# Patient Record
Sex: Female | Born: 1945 | Race: White | Hispanic: No | Smoking: Never smoker
Health system: Southern US, Community
[De-identification: ages and names within clinical notes are randomized; demographics above are authoritative.]

## PROBLEM LIST (undated history)

## (undated) DIAGNOSIS — R011 Cardiac murmur, unspecified: Secondary | ICD-10-CM

## (undated) DIAGNOSIS — I10 Essential (primary) hypertension: Secondary | ICD-10-CM

## (undated) DIAGNOSIS — T7840XA Allergy, unspecified, initial encounter: Secondary | ICD-10-CM

## (undated) DIAGNOSIS — R112 Nausea with vomiting, unspecified: Secondary | ICD-10-CM

## (undated) DIAGNOSIS — Z8489 Family history of other specified conditions: Secondary | ICD-10-CM

## (undated) DIAGNOSIS — Z9889 Other specified postprocedural states: Secondary | ICD-10-CM

## (undated) DIAGNOSIS — M1990 Unspecified osteoarthritis, unspecified site: Secondary | ICD-10-CM

## (undated) DIAGNOSIS — H269 Unspecified cataract: Secondary | ICD-10-CM

## (undated) HISTORY — PX: CHOLECYSTECTOMY: SHX55

## (undated) HISTORY — PX: REPLACEMENT TOTAL KNEE BILATERAL: SUR1225

## (undated) HISTORY — PX: BREAST EXCISIONAL BIOPSY: SUR124

## (undated) HISTORY — PX: ABDOMINAL HYSTERECTOMY: SHX81

## (undated) HISTORY — DX: Allergy, unspecified, initial encounter: T78.40XA

## (undated) HISTORY — DX: Unspecified cataract: H26.9

## (undated) HISTORY — PX: EYE SURGERY: SHX253

## (undated) HISTORY — PX: TOTAL HIP ARTHROPLASTY: SHX124

## (undated) HISTORY — PX: JOINT REPLACEMENT: SHX530

---

## 2001-10-10 ENCOUNTER — Encounter: Payer: Self-pay | Admitting: Orthopedic Surgery

## 2001-10-10 ENCOUNTER — Ambulatory Visit (HOSPITAL_COMMUNITY): Admission: RE | Admit: 2001-10-10 | Discharge: 2001-10-10 | Payer: Self-pay | Admitting: Orthopedic Surgery

## 2002-12-04 ENCOUNTER — Encounter: Payer: Self-pay | Admitting: Internal Medicine

## 2002-12-04 ENCOUNTER — Ambulatory Visit (HOSPITAL_COMMUNITY): Admission: RE | Admit: 2002-12-04 | Discharge: 2002-12-04 | Payer: Self-pay | Admitting: Internal Medicine

## 2002-12-15 ENCOUNTER — Encounter: Payer: Self-pay | Admitting: Internal Medicine

## 2002-12-15 ENCOUNTER — Ambulatory Visit (HOSPITAL_COMMUNITY): Admission: RE | Admit: 2002-12-15 | Discharge: 2002-12-15 | Payer: Self-pay | Admitting: Internal Medicine

## 2003-06-25 ENCOUNTER — Ambulatory Visit (HOSPITAL_COMMUNITY): Admission: RE | Admit: 2003-06-25 | Discharge: 2003-06-25 | Payer: Self-pay | Admitting: Internal Medicine

## 2003-06-25 ENCOUNTER — Encounter: Payer: Self-pay | Admitting: Internal Medicine

## 2003-11-16 ENCOUNTER — Encounter: Payer: Self-pay | Admitting: Orthopedic Surgery

## 2005-03-20 ENCOUNTER — Ambulatory Visit: Payer: Self-pay | Admitting: Orthopedic Surgery

## 2006-03-19 ENCOUNTER — Ambulatory Visit: Payer: Self-pay | Admitting: Orthopedic Surgery

## 2006-05-29 ENCOUNTER — Ambulatory Visit (HOSPITAL_COMMUNITY): Admission: RE | Admit: 2006-05-29 | Discharge: 2006-05-29 | Payer: Self-pay | Admitting: Obstetrics & Gynecology

## 2006-06-14 ENCOUNTER — Ambulatory Visit (HOSPITAL_COMMUNITY): Admission: RE | Admit: 2006-06-14 | Discharge: 2006-06-14 | Payer: Self-pay | Admitting: Obstetrics & Gynecology

## 2007-03-29 ENCOUNTER — Encounter (INDEPENDENT_AMBULATORY_CARE_PROVIDER_SITE_OTHER): Payer: Self-pay | Admitting: Family Medicine

## 2007-06-03 ENCOUNTER — Ambulatory Visit (HOSPITAL_COMMUNITY): Admission: RE | Admit: 2007-06-03 | Discharge: 2007-06-03 | Payer: Self-pay | Admitting: Internal Medicine

## 2007-06-10 ENCOUNTER — Ambulatory Visit: Payer: Self-pay | Admitting: Gastroenterology

## 2007-06-10 ENCOUNTER — Ambulatory Visit (HOSPITAL_COMMUNITY): Admission: RE | Admit: 2007-06-10 | Discharge: 2007-06-10 | Payer: Self-pay | Admitting: Gastroenterology

## 2007-07-01 ENCOUNTER — Ambulatory Visit: Payer: Self-pay | Admitting: Orthopedic Surgery

## 2007-07-15 ENCOUNTER — Ambulatory Visit: Payer: Self-pay | Admitting: Orthopedic Surgery

## 2007-07-18 ENCOUNTER — Ambulatory Visit: Payer: Self-pay | Admitting: Orthopedic Surgery

## 2007-08-20 ENCOUNTER — Inpatient Hospital Stay (HOSPITAL_COMMUNITY): Admission: RE | Admit: 2007-08-20 | Discharge: 2007-08-23 | Payer: Self-pay | Admitting: Orthopedic Surgery

## 2007-08-20 ENCOUNTER — Ambulatory Visit: Payer: Self-pay | Admitting: Orthopedic Surgery

## 2007-08-20 ENCOUNTER — Encounter: Payer: Self-pay | Admitting: Orthopedic Surgery

## 2007-09-03 ENCOUNTER — Ambulatory Visit: Payer: Self-pay | Admitting: Orthopedic Surgery

## 2007-09-10 ENCOUNTER — Encounter (HOSPITAL_COMMUNITY): Admission: RE | Admit: 2007-09-10 | Discharge: 2007-09-24 | Payer: Self-pay | Admitting: Orthopedic Surgery

## 2007-09-17 ENCOUNTER — Ambulatory Visit: Payer: Self-pay | Admitting: Orthopedic Surgery

## 2007-09-25 ENCOUNTER — Encounter (HOSPITAL_COMMUNITY): Admission: RE | Admit: 2007-09-25 | Discharge: 2007-10-25 | Payer: Self-pay | Admitting: Orthopedic Surgery

## 2007-10-15 ENCOUNTER — Ambulatory Visit: Payer: Self-pay | Admitting: Orthopedic Surgery

## 2007-10-15 DIAGNOSIS — M1710 Unilateral primary osteoarthritis, unspecified knee: Secondary | ICD-10-CM | POA: Insufficient documentation

## 2007-11-12 ENCOUNTER — Ambulatory Visit: Payer: Self-pay | Admitting: Orthopedic Surgery

## 2008-02-10 ENCOUNTER — Ambulatory Visit: Payer: Self-pay | Admitting: Orthopedic Surgery

## 2008-02-10 DIAGNOSIS — M6740 Ganglion, unspecified site: Secondary | ICD-10-CM | POA: Insufficient documentation

## 2008-06-03 ENCOUNTER — Ambulatory Visit (HOSPITAL_COMMUNITY): Admission: RE | Admit: 2008-06-03 | Discharge: 2008-06-03 | Payer: Self-pay | Admitting: Family Medicine

## 2008-08-05 ENCOUNTER — Ambulatory Visit: Payer: Self-pay | Admitting: Orthopedic Surgery

## 2008-08-05 ENCOUNTER — Ambulatory Visit (HOSPITAL_COMMUNITY): Admission: RE | Admit: 2008-08-05 | Discharge: 2008-08-05 | Payer: Self-pay | Admitting: Orthopedic Surgery

## 2008-08-20 ENCOUNTER — Telehealth: Payer: Self-pay | Admitting: Orthopedic Surgery

## 2010-12-20 ENCOUNTER — Emergency Department (HOSPITAL_COMMUNITY)
Admission: EM | Admit: 2010-12-20 | Discharge: 2010-12-20 | Payer: Self-pay | Source: Home / Self Care | Admitting: Emergency Medicine

## 2011-05-09 NOTE — Op Note (Signed)
[name], [name]                ACCOUNT NO.:  [id number]   MEDICAL RECORD NO.:  [id number]          PATIENT TYPE:  AMB   LOCATION:  DAY                           FACILITY:  [facility]   PHYSICIAN:  Kassie Mends, M.D.      DATE OF BIRTH:  June 24, 1946   DATE OF PROCEDURE:  06/10/2007  DATE OF DISCHARGE:                               OPERATIVE REPORT   PROCEDURE:  Colonoscopy.   INDICATIONS FOR EXAM:  [name] is a 65 year old female who presents  for average risk colon cancer screening.   FINDINGS:  1. Normal colon without evidence of polyps, masses, inflammatory      changes, diverticula, or AVMs.  2. Normal retroflexed view of the rectum.   RECOMMENDATIONS:  1. She should follow a high fiber diet.  A high fiber diet is      associated with a decreased risk of colon cancer.  She was given a      handout on high fiber diet.  2. Screening colonoscopy in 10 years.   MEDICATIONS:  1. Demerol 75 mg IV.  2. Versed 6 mg IV.   PROCEDURE TECHNIQUE:  Physical examination was performed and informed  consent was obtained from the patient after explaining the benefits,  risks, and alternatives to the procedure.  The patient was connected to  the monitor and was placed in the left lateral position.  Continuous  oxygen was provided by nasal cannula and IV medicine was administered  through an indwelling cannula.  After administration of sedation and  rectal examination, the patient's rectum was intubated and the scope was  advanced under direct visualization to the cecum.  The scope was  withdrawn, by carefully examining the color, texture, anatomy and  integrity of the mucosa on the way out.  The patient was recovered in  endoscopy and discharged home in satisfactory condition.      Kassie Mends, M.D.  Electronically Signed    SM/MEDQ  D:  06/10/2007  T:  06/10/2007  Job:  [id number]   cc:   Kirk Ruths, M.D.  Fax: [phone]

## 2011-05-09 NOTE — Discharge Summary (Signed)
Brenda [name], Brenda [name]                ACCOUNT NO.:  [id number]   MEDICAL RECORD NO.:  [id number]          PATIENT TYPE:  INP   LOCATION:  A308                          FACILITY:  [facility]   PHYSICIAN:  Vickki Hearing, M.D.DATE OF BIRTH:  1946-05-18   DATE OF ADMISSION:  08/20/2007  DATE OF DISCHARGE:  08/29/2008LH                               DISCHARGE SUMMARY   ADMISSION DIAGNOSIS:  Osteoarthritis of the right knee.   DISCHARGE DIAGNOSIS:  Osteoarthritis of the right knee.   SURGICAL PROCEDURES:  Right total knee arthroplasty, date of surgery  August 20, 2007.  This was a computer-assisted knee replacement with  pinless computer system.  Surgery was done with a spinal anesthetic and  the operative findings of valgus osteoarthritis, bone-to-bone changes  lateral compartment.  Primary wear on the distal femoral condyle.  There  was a slight flexion contracture.  Preoperative range of motion was 125  under anesthesia.   IMPLANTS:  Stryker posterior-stabilized #4 femur, #4 tibia, sized #4 9-  mm polyethylene insert, size 29-mm patella.  We did a lateral release  and the patient had a valgus osteoarthritic right knee.  Our  intraoperative range of motion was a 125 degrees on passive flexion,  test full extension.   HISTORY:  This is a 65 year old female who has had four knee  arthroscopies on the left knee, two on the right status, post right  total hip 1999, left total knee 2001, bilateral carpal tunnel releases  and a cholecystectomy, who presented for right total knee replacement  for osteoarthritis of the right knee which failed treatment with anti-  inflammatories, analgesics and injections.   The patient was admitted on August 26 for a right total knee  replacement.  She had the procedure as stated, tolerated it well, and  was started on a post total knee protocol which, included PCA pump, CPM  and Coumadin.   She did very well.  She was able to get 80 degrees of passive  flexion in  her last physical therapy visit with the 3 degrees of extension.  She  had 200 feet ambulation with standby assist.   She had some slight swelling in the knee.  She was afebrile.  The  incision looked great.   We did note a hemoglobin of 7.6, started at 11.1, but she was  asymptomatic.  She was put on iron.  Her PT/INR on the 29th showed an  INR of 4.4, so the patient was not sent home on Coumadin.  We will let  this taper office.  It should be fine for the 10-day period of  anticoagulation.   She is sent home with CPM, a walker, a Cryo/Cuff, a knee immobilizer for  night time use, TED stocking on the right leg, an exercise program,  physical therapy and CPM machine.  She has a follow-up visit for the 9th  to have the staples taken out.   She is discharged on the following medications:  1. Feosol one every 12 hours.  2. Tylox one q.4h. p.r.n. for pain, #60.  3. Colace 100 mg b.i.d.,  also #60.  4. She is to resume her preadmission medications, which include      multivitamin, Caltrate B6.   She is discharged home in stable condition.      Vickki Hearing, M.D.  Electronically Signed     SEH/MEDQ  D:  08/23/2007  T:  08/24/2007  Job:  [id number]

## 2011-05-09 NOTE — H&P (Signed)
Brenda [name], HOUGLAND                ACCOUNT NO.:  [id number]   MEDICAL RECORD NO.:  [id number]          PATIENT TYPE:  AMB   LOCATION:  DAY                           FACILITY:  [facility]   PHYSICIAN:  Vickki Hearing, M.D.DATE OF BIRTH:  [birth year]   DATE OF ADMISSION:  DATE OF DISCHARGE:  LH                              HISTORY & PHYSICAL   HISTORY:  The patient is a 65 year old female status post four  arthroscopies on the left knee and two on the right, status post right  total hip in 1999, left total knee in 2001, status post bilateral carpal  tunnel releases in 1975 and a cholecystectomy.   FAMILY HISTORY:  She has a family history of heart disease and  arthritis.   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   PAST MEDICAL HISTORY:  She has a previous history of carpal tunnel  syndrome.   MEDICATIONS:  She takes aspirin, Osteo-BiFlex, TheraTears, Echinacea,  calcium, Centrum, B12, B6, iron supplement and hydrochlorothiazide 50 mg  daily.   The patient presents for right knee pain which have progressed and  become severe inhibiting her activities of daily living and she is  presenting for right total knee replacement.   She has been treated with anti-inflammatories, analgesics and  injections.  She did well for several months to years but recently has  gotten worse and wishes to have the surgery done on the right knee.   REVIEW OF SYSTEMS:  Negative x10.   PHYSICAL EXAMINATION:  GENERAL APPEARANCE:  Her exam reveals a healthy,  well-developed, well-nourished female.  Grooming and hygiene are intact.  MUSCULOSKELETAL:  There are no deformities other than some mild valgus.  CARDIOVASCULAR:  There is no cardiovascular function.  VITAL SIGNS:  Normal pulses, temperature.  EXTREMITIES:  No edema, tenderness or swelling.  She has range of motion  125 degrees of  knee flexion.  There is no  fusion.  There is some  lateral joint tenderness.  Strength and stability of the knee are  normal.   The previously-noted surgical scars are well-healed without any  erythema.  LYMPH NODES:  Normal.  NEUROLOGICAL:  Gait and station are characteristic of implants as stated  above.  Neurological tests are normal.  She is awake, alert and oriented  x3 with normal mood and affect.   CLINICAL DATA:  X-rays show end-stage arthritis of the right knee.   PLAN:  Right total knee replacement.  The plan is to do a computer-  assisted pinless technique with Stryker knee system.      Vickki Hearing, M.D.  Electronically Signed     SEH/MEDQ  D:  08/19/2007  T:  08/19/2007  Job:  [id number]

## 2011-05-09 NOTE — Op Note (Signed)
NAMEDONJA, [name]                ACCOUNT NO.:  [id number]   MEDICAL RECORD NO.:  [id number]          PATIENT TYPE:  INP   LOCATION:  A308                          FACILITY:  [facility]   PHYSICIAN:  Vickki Hearing, M.D.DATE OF BIRTH:  03-01-1946   DATE OF PROCEDURE:  08/20/2007  DATE OF DISCHARGE:                               OPERATIVE REPORT   HISTORY:  A 65 year old female status post right total hip left total  knee presents with osteoarthritis right knee which failed nonoperative  treatment.   PREOP DIAGNOSIS:  Valgus osteoarthritis right knee.   POSTOP DIAGNOSIS:  Valgus osteoarthritis right knee.   PROCEDURE:  Right total knee.   ANESTHETIC: SPINAL   IMPLANTS:  Striker posterior stabilized 4 femur, 4 tibia, size four 9-mm  polyethylene insert, and a size 29, 9-mm patella.  We also inserted a  pain pump catheter with 1/4% Sensorcaine.   OPERATIVE FINDINGS:  Valgus osteoarthritis right knee with bone-to-bone  changes in the lateral compartment, wear of the distal femoral condyle,  and a slight flexion contracture.  Preop motion was approximately 125  degrees under anesthesia.   DESCRIPTION OF PROCEDURE:  This patient was identified in the preop  holding area, as Brenda [name], her right knee was marked for surgery.  I  countersigned that.  She was given her preoperative antibiotic in the  OR.  She was given a spinal anesthetic in the OR.  She was placed on the  OR table supine.  Foley catheter was inserted; tourniquet was applied to  the right thigh; the leg was prepped and draped with DuraPrep, and  sterile draping technique.  The limb was exsanguinated with a 6-inch  Esmarch, and the tourniquet was inflated to 300 mmHg where it remained  for 2 hours.   The incision was made with the knee in flexion.  The subcutaneous tissue  was divided.  The extensor mechanism was incised with a standard median  parapatellar approach.  Patella was everted.  The medial soft tissue  sleeve was elevated to the mid coronal plane of the tibia.  The lateral  meniscus, medial meniscus, ACL, and PCL were resected.   We used a pinless computer-assisted surgery technique.  We pinned the  tibial marking device, applied the sensors, and applied the cutting  block; adjusted it to take 4-mm resection from the depth of the lateral  compartment using neutral coronal and sagittal alignment.   After this block was pinned, the cut was made with an oscillating saw.  The tibia was sized to a 4.   We applied the alignment device to the distal femur and set that for a  10-mm resection from the medial prominent condyle, made the distal cut,  dropped it back 2 mm as the lateral side would not have resected any  bone.  The femur was then sized to a size 4-1/2, we downsized it to a 4;  and made those four cuts; then checked extension-flexion gap, extension  gap was tight.  We recut the femur an additional 2 mm and this allowed a  spacer block to be  placed for a 9-mm insert.  Flexion gap was equal to  extension gap with lateral compartmnet tightness.  this was released by  releasing the iliotibial band.   The box cut was then made, patella was cut from a 23 down to a 13.  Three peg holes were made.   The wound was irrigated, and the trial implants were placed. The knee  was taken through a range of motion.  Tibial rotation was marked; the  patella had subluxation; it was released until tracking was normal with  a no-touch technique. Bone was irrigated.  Bone was dried.   We then cemented the implants in place, removed excess cement and after  curing trialed with a 9-mm insert, we found this to allow full extension  and flexion with good stability.   We put in a 9-mm polyethylene tray.  All excess cement was removed.  The  knee was irrigated; injected with 60 mL of Sensorcaine with epinephrine.  Capsule was closed with a #1 Bralon suture in an interrupted-and-running  fashion.  The  rent in the lateral capsule was closed with one Bralon  with the knee in flexion; and the patella reduced.   Subcu tissue was closed with #0 and 2-0 Monocryl.  Pain pump catheter  was in the subcu tissue.   Knee was dressed sterilely.  A cryo cuff was applied.   Postop protocol will be the usual routine      Vickki Hearing, M.D.  Electronically Signed     SEH/MEDQ  D:  08/20/2007  T:  08/20/2007  Job:  [id number]

## 2011-10-03 ENCOUNTER — Other Ambulatory Visit (HOSPITAL_COMMUNITY): Payer: Self-pay | Admitting: Family Medicine

## 2011-10-03 DIAGNOSIS — Z139 Encounter for screening, unspecified: Secondary | ICD-10-CM

## 2011-10-06 LAB — CBC
HCT: 22 — ABNORMAL LOW
HCT: 24.6 — ABNORMAL LOW
HCT: 25.2 — ABNORMAL LOW
HCT: 33.4 — ABNORMAL LOW
Hemoglobin: 11.1 — ABNORMAL LOW
Hemoglobin: 8.3 — ABNORMAL LOW
Hemoglobin: 8.4 — ABNORMAL LOW
MCHC: 33.1
MCHC: 33.5
MCHC: 33.6
MCHC: 34.4
MCV: 78.4
MCV: 78.9
MCV: 79.8
Platelets: 201
Platelets: 202
Platelets: 287
RBC: 2.83 — ABNORMAL LOW
RBC: 3.12 — ABNORMAL LOW
RBC: 3.22 — ABNORMAL LOW
RBC: 4.18
RDW: 14.5 — ABNORMAL HIGH
RDW: 14.9 — ABNORMAL HIGH
RDW: 15.1 — ABNORMAL HIGH
RDW: 15.5 — ABNORMAL HIGH
WBC: 11.1 — ABNORMAL HIGH
WBC: 5.6
WBC: 9

## 2011-10-06 LAB — BASIC METABOLIC PANEL WITH GFR
BUN: 19
BUN: 7
CO2: 30
CO2: 31
Calcium: 7.8 — ABNORMAL LOW
Calcium: 8.7
Chloride: 101
Chloride: 96
Creatinine, Ser: 0.63
Creatinine, Ser: 0.65
GFR calc non Af Amer: >60
GFR calc non Af Amer: >60
Glucose, Bld: 125 — ABNORMAL HIGH
Glucose, Bld: 96
Potassium: 3.2 — ABNORMAL LOW
Potassium: 3.7
Sodium: 134 — ABNORMAL LOW
Sodium: 138

## 2011-10-06 LAB — DIFFERENTIAL
Basophils Relative: 0
Eosinophils Absolute: 0
Eosinophils Relative: 1
Lymphocytes Relative: 10 — ABNORMAL LOW
Lymphocytes Relative: 9 — ABNORMAL LOW
Lymphs Abs: 0.8
Lymphs Abs: 1.1
Monocytes Absolute: 0.7
Monocytes Absolute: 1.1 — ABNORMAL HIGH
Monocytes Absolute: 1.2 — ABNORMAL HIGH
Monocytes Relative: 11
Monocytes Relative: 12 — ABNORMAL HIGH
Monocytes Relative: 8
Neutro Abs: 6.7
Neutro Abs: 8.7 — ABNORMAL HIGH
Neutrophils Relative %: 82 — ABNORMAL HIGH

## 2011-10-06 LAB — BASIC METABOLIC PANEL
BUN: 6
Calcium: 7.8 — ABNORMAL LOW
Calcium: 8.2 — ABNORMAL LOW
Creatinine, Ser: 0.58
Creatinine, Ser: 0.59
GFR calc Af Amer: >60
GFR calc non Af Amer: >60
Potassium: 3.2 — ABNORMAL LOW
Potassium: 3.8
Sodium: 141

## 2011-10-06 LAB — CROSSMATCH
ABO/RH(D): O NEG
Antibody Screen: NEGATIVE

## 2011-10-06 LAB — PROTIME-INR
INR: 0.9
INR: 1.1
INR: 4.4 — ABNORMAL HIGH
Prothrombin Time: 12.8
Prothrombin Time: 14.7
Prothrombin Time: 44.6 — ABNORMAL HIGH

## 2011-10-06 LAB — ABO/RH: ABO/RH(D): O NEG

## 2011-10-06 LAB — APTT: aPTT: 32

## 2011-10-10 ENCOUNTER — Ambulatory Visit (HOSPITAL_COMMUNITY)
Admission: RE | Admit: 2011-10-10 | Discharge: 2011-10-10 | Disposition: A | Discharge status: Home / Self Care | Payer: Medicare Other | Source: Ambulatory Visit | Attending: Family Medicine | Admitting: Family Medicine

## 2011-10-10 DIAGNOSIS — Z1231 Encounter for screening mammogram for malignant neoplasm of breast: Secondary | ICD-10-CM | POA: Insufficient documentation

## 2011-10-10 DIAGNOSIS — Z139 Encounter for screening, unspecified: Secondary | ICD-10-CM

## 2012-10-14 ENCOUNTER — Other Ambulatory Visit (HOSPITAL_COMMUNITY): Payer: Self-pay | Admitting: Family Medicine

## 2012-10-14 DIAGNOSIS — Z139 Encounter for screening, unspecified: Secondary | ICD-10-CM

## 2012-10-15 ENCOUNTER — Ambulatory Visit (HOSPITAL_COMMUNITY)
Admission: RE | Admit: 2012-10-15 | Discharge: 2012-10-15 | Disposition: A | Discharge status: Home / Self Care | Payer: Medicare Other | Source: Ambulatory Visit | Attending: Family Medicine | Admitting: Family Medicine

## 2012-10-15 DIAGNOSIS — Z1231 Encounter for screening mammogram for malignant neoplasm of breast: Secondary | ICD-10-CM | POA: Insufficient documentation

## 2012-10-15 DIAGNOSIS — Z139 Encounter for screening, unspecified: Secondary | ICD-10-CM

## 2013-10-13 ENCOUNTER — Other Ambulatory Visit (HOSPITAL_COMMUNITY): Payer: Self-pay | Admitting: Family Medicine

## 2013-10-13 DIAGNOSIS — Z139 Encounter for screening, unspecified: Secondary | ICD-10-CM

## 2013-10-20 ENCOUNTER — Ambulatory Visit (HOSPITAL_COMMUNITY)
Admission: RE | Admit: 2013-10-20 | Discharge: 2013-10-20 | Disposition: A | Discharge status: Home / Self Care | Payer: Medicare Other | Source: Ambulatory Visit | Attending: Family Medicine | Admitting: Family Medicine

## 2013-10-20 DIAGNOSIS — Z1231 Encounter for screening mammogram for malignant neoplasm of breast: Secondary | ICD-10-CM | POA: Insufficient documentation

## 2013-10-20 DIAGNOSIS — Z139 Encounter for screening, unspecified: Secondary | ICD-10-CM

## 2014-06-17 ENCOUNTER — Other Ambulatory Visit (HOSPITAL_COMMUNITY): Payer: Self-pay | Admitting: Internal Medicine

## 2014-06-17 DIAGNOSIS — Z01419 Encounter for gynecological examination (general) (routine) without abnormal findings: Secondary | ICD-10-CM

## 2014-06-29 ENCOUNTER — Ambulatory Visit (HOSPITAL_COMMUNITY)
Admission: RE | Admit: 2014-06-29 | Discharge: 2014-06-29 | Disposition: A | Discharge status: Home / Self Care | Payer: Medicare Other | Source: Ambulatory Visit | Attending: Internal Medicine | Admitting: Internal Medicine

## 2014-06-29 DIAGNOSIS — Z01419 Encounter for gynecological examination (general) (routine) without abnormal findings: Secondary | ICD-10-CM | POA: Insufficient documentation

## 2014-06-29 DIAGNOSIS — Z13820 Encounter for screening for osteoporosis: Secondary | ICD-10-CM | POA: Insufficient documentation

## 2014-12-03 ENCOUNTER — Other Ambulatory Visit (HOSPITAL_COMMUNITY): Payer: Self-pay | Admitting: Physician Assistant

## 2014-12-03 DIAGNOSIS — Z1231 Encounter for screening mammogram for malignant neoplasm of breast: Secondary | ICD-10-CM

## 2014-12-21 ENCOUNTER — Ambulatory Visit (HOSPITAL_COMMUNITY)
Admission: RE | Admit: 2014-12-21 | Discharge: 2014-12-21 | Disposition: A | Discharge status: Home / Self Care | Payer: Medicare Other | Source: Ambulatory Visit | Attending: Physician Assistant | Admitting: Physician Assistant

## 2014-12-21 DIAGNOSIS — R921 Mammographic calcification found on diagnostic imaging of breast: Secondary | ICD-10-CM | POA: Diagnosis not present

## 2014-12-21 DIAGNOSIS — Z1231 Encounter for screening mammogram for malignant neoplasm of breast: Secondary | ICD-10-CM | POA: Diagnosis present

## 2014-12-23 ENCOUNTER — Other Ambulatory Visit: Payer: Self-pay | Admitting: Physician Assistant

## 2014-12-23 DIAGNOSIS — R928 Other abnormal and inconclusive findings on diagnostic imaging of breast: Secondary | ICD-10-CM

## 2015-01-04 DIAGNOSIS — Z6829 Body mass index (BMI) 29.0-29.9, adult: Secondary | ICD-10-CM | POA: Diagnosis not present

## 2015-01-04 DIAGNOSIS — Z7141 Alcohol abuse counseling and surveillance of alcoholic: Secondary | ICD-10-CM | POA: Diagnosis not present

## 2015-01-04 DIAGNOSIS — E663 Overweight: Secondary | ICD-10-CM | POA: Diagnosis not present

## 2015-01-04 DIAGNOSIS — I10 Essential (primary) hypertension: Secondary | ICD-10-CM | POA: Diagnosis not present

## 2015-01-26 ENCOUNTER — Other Ambulatory Visit: Payer: Self-pay | Admitting: Physician Assistant

## 2015-01-26 ENCOUNTER — Ambulatory Visit (HOSPITAL_COMMUNITY)
Admission: RE | Admit: 2015-01-26 | Discharge: 2015-01-26 | Disposition: A | Discharge status: Home / Self Care | Payer: Medicare Other | Source: Ambulatory Visit | Attending: Physician Assistant | Admitting: Physician Assistant

## 2015-01-26 DIAGNOSIS — R921 Mammographic calcification found on diagnostic imaging of breast: Secondary | ICD-10-CM

## 2015-01-26 DIAGNOSIS — R928 Other abnormal and inconclusive findings on diagnostic imaging of breast: Secondary | ICD-10-CM | POA: Diagnosis not present

## 2015-02-01 DIAGNOSIS — Z23 Encounter for immunization: Secondary | ICD-10-CM | POA: Diagnosis not present

## 2015-02-01 DIAGNOSIS — E663 Overweight: Secondary | ICD-10-CM | POA: Diagnosis not present

## 2015-02-01 DIAGNOSIS — Z6828 Body mass index (BMI) 28.0-28.9, adult: Secondary | ICD-10-CM | POA: Diagnosis not present

## 2015-02-01 DIAGNOSIS — Z01419 Encounter for gynecological examination (general) (routine) without abnormal findings: Secondary | ICD-10-CM | POA: Diagnosis not present

## 2015-02-15 ENCOUNTER — Ambulatory Visit
Admission: RE | Admit: 2015-02-15 | Discharge: 2015-02-15 | Disposition: A | Discharge status: Home / Self Care | Payer: Medicare Other | Source: Ambulatory Visit | Attending: Physician Assistant | Admitting: Physician Assistant

## 2015-02-15 ENCOUNTER — Other Ambulatory Visit: Payer: Self-pay | Admitting: Physician Assistant

## 2015-02-15 DIAGNOSIS — R921 Mammographic calcification found on diagnostic imaging of breast: Secondary | ICD-10-CM | POA: Diagnosis not present

## 2015-02-15 DIAGNOSIS — N6489 Other specified disorders of breast: Secondary | ICD-10-CM | POA: Diagnosis not present

## 2015-06-07 DIAGNOSIS — Z6825 Body mass index (BMI) 25.0-25.9, adult: Secondary | ICD-10-CM | POA: Diagnosis not present

## 2015-06-07 DIAGNOSIS — E782 Mixed hyperlipidemia: Secondary | ICD-10-CM | POA: Diagnosis not present

## 2015-06-07 DIAGNOSIS — I10 Essential (primary) hypertension: Secondary | ICD-10-CM | POA: Diagnosis not present

## 2015-06-07 DIAGNOSIS — E663 Overweight: Secondary | ICD-10-CM | POA: Diagnosis not present

## 2015-06-15 DIAGNOSIS — E782 Mixed hyperlipidemia: Secondary | ICD-10-CM | POA: Diagnosis not present

## 2015-06-15 DIAGNOSIS — Z6825 Body mass index (BMI) 25.0-25.9, adult: Secondary | ICD-10-CM | POA: Diagnosis not present

## 2015-06-15 DIAGNOSIS — Z0000 Encounter for general adult medical examination without abnormal findings: Secondary | ICD-10-CM | POA: Diagnosis not present

## 2015-06-15 DIAGNOSIS — E663 Overweight: Secondary | ICD-10-CM | POA: Diagnosis not present

## 2015-06-15 DIAGNOSIS — I10 Essential (primary) hypertension: Secondary | ICD-10-CM | POA: Diagnosis not present

## 2015-10-06 DIAGNOSIS — Z23 Encounter for immunization: Secondary | ICD-10-CM | POA: Diagnosis not present

## 2015-11-09 ENCOUNTER — Other Ambulatory Visit (HOSPITAL_COMMUNITY): Payer: Self-pay | Admitting: Physician Assistant

## 2015-11-09 DIAGNOSIS — Z1231 Encounter for screening mammogram for malignant neoplasm of breast: Secondary | ICD-10-CM

## 2015-11-29 DIAGNOSIS — Z6823 Body mass index (BMI) 23.0-23.9, adult: Secondary | ICD-10-CM | POA: Diagnosis not present

## 2015-11-29 DIAGNOSIS — Z1389 Encounter for screening for other disorder: Secondary | ICD-10-CM | POA: Diagnosis not present

## 2015-11-29 DIAGNOSIS — J069 Acute upper respiratory infection, unspecified: Secondary | ICD-10-CM | POA: Diagnosis not present

## 2015-12-03 DIAGNOSIS — Z6822 Body mass index (BMI) 22.0-22.9, adult: Secondary | ICD-10-CM | POA: Diagnosis not present

## 2015-12-03 DIAGNOSIS — R05 Cough: Secondary | ICD-10-CM | POA: Diagnosis not present

## 2015-12-03 DIAGNOSIS — Z1389 Encounter for screening for other disorder: Secondary | ICD-10-CM | POA: Diagnosis not present

## 2015-12-03 DIAGNOSIS — J329 Chronic sinusitis, unspecified: Secondary | ICD-10-CM | POA: Diagnosis not present

## 2015-12-24 ENCOUNTER — Ambulatory Visit (HOSPITAL_COMMUNITY)
Admission: RE | Admit: 2015-12-24 | Discharge: 2015-12-24 | Disposition: A | Discharge status: Home / Self Care | Payer: Medicare Other | Source: Ambulatory Visit | Attending: Physician Assistant | Admitting: Physician Assistant

## 2015-12-24 DIAGNOSIS — Z1231 Encounter for screening mammogram for malignant neoplasm of breast: Secondary | ICD-10-CM | POA: Insufficient documentation

## 2016-05-26 DIAGNOSIS — Z1389 Encounter for screening for other disorder: Secondary | ICD-10-CM | POA: Diagnosis not present

## 2016-05-26 DIAGNOSIS — I10 Essential (primary) hypertension: Secondary | ICD-10-CM | POA: Diagnosis not present

## 2016-05-26 DIAGNOSIS — E782 Mixed hyperlipidemia: Secondary | ICD-10-CM | POA: Diagnosis not present

## 2016-05-26 DIAGNOSIS — Z6821 Body mass index (BMI) 21.0-21.9, adult: Secondary | ICD-10-CM | POA: Diagnosis not present

## 2016-06-01 ENCOUNTER — Ambulatory Visit (INDEPENDENT_AMBULATORY_CARE_PROVIDER_SITE_OTHER): Payer: Medicare Other

## 2016-06-01 ENCOUNTER — Ambulatory Visit (INDEPENDENT_AMBULATORY_CARE_PROVIDER_SITE_OTHER): Payer: Medicare Other | Admitting: Orthopedic Surgery

## 2016-06-01 VITALS — BP 114/75 | HR 85 | Ht 64.0 in | Wt 131.6 lb

## 2016-06-01 DIAGNOSIS — Z96652 Presence of left artificial knee joint: Secondary | ICD-10-CM | POA: Diagnosis not present

## 2016-06-01 DIAGNOSIS — Z96649 Presence of unspecified artificial hip joint: Secondary | ICD-10-CM

## 2016-06-01 DIAGNOSIS — Z9660 Presence of unspecified orthopedic joint implant: Secondary | ICD-10-CM | POA: Diagnosis not present

## 2016-06-01 DIAGNOSIS — Z96651 Presence of right artificial knee joint: Secondary | ICD-10-CM | POA: Diagnosis not present

## 2016-06-01 MED ORDER — CLINDAMYCIN HCL 300 MG PO CAPS: ORAL_CAPSULE | ORAL | Status: DC

## 2016-06-01 NOTE — Progress Notes (Signed)
Patient ID: Brenda [name], female   DOB: [birth year], 70 y.o.   MRN: [id number]  Chief Complaint  Patient presents with  . New Patient (Initial Visit)    Right hip DOS 03/23/1998, Right knee DOS 08/20/2007 Left knee DOS3/27/2001    HPI the patient is 18 years status post right total hip and 9 year status post right total knee and 16 years status post left total knee doing well. However, she is concerned that her dentist will no longer prescribed prophylactic antibiotics for her.  She would like her hips and knees evaluated and then discuss her situation regarding antibiotics  ROS  She reports no abnormalities on review of systems specifically no chest pain shortness of breath constipation diarrhea photophobia night sweats fever.  She had a tonsillectomy she had bilateral carpal tunnel releases partial hysterectomy prior knee surgery had a left knee arthroscopy and arthrotomy 3 she had right knee arthroscopy cholecystectomy total left knee total right knee and total right hip as stated   BP 114/75 mmHg  Pulse 85  Ht 5\' 4"  (1.626 m)  Wt 131 lb 9.6 oz (59.693 kg)  BMI 22.58 kg/m2  Physical Exam Physical Exam  Constitutional: The patient appears well-developed and well-nourished. No distress.  The patient is oriented to person, place, and time.  Psychiatric: The patient has a normal mood and affect.  Cardiovascular: Intact distal pulses.   Neurological: sensation is normal  Skin: Skin is warm and dry. No rash noted. The patient is not diaphoretic. No erythema. No pallor.    Ortho Exam Normal range of motion in the right hip with no instability muscle strength and tone are normal leg lengths are equal no tenderness  Both knees have well-healed incisions in the midline knee flexion is 120 on the right 125 on the left both knees are stable right and left knee which exhibit normal strength and muscle tone skin incisions clean dry and intact normal distal pulses without edema in each leg  and normal sensation in the right and left leg as well  X-rays of the hip show no abnormalities in the stem or cup  Both knee film shows stable total knees with no loosening please see report for details   ASSESSMENT AND PLAN   I reviewed the current recommendations regarding dental prophylaxis and the patient decided to continue with prophylaxis she is allergic to penicillin so we put her on clindamycin with standing order for refills as needed for dental procedure Fuller CanadaStanley Harrison, MD 06/01/2016 5:38 PM

## 2016-07-01 IMAGING — MG MM DIGITAL DIAGNOSTIC UNILAT*R*
2 series · 2 of 2 positions shown · non-contrast
Comparison: Prior mammograms.

CLINICAL DATA: Screening recall for calcifications in the central
right breast.

EXAM:
DIGITAL DIAGNOSTIC RIGHT MAMMOGRAM

[R CC]
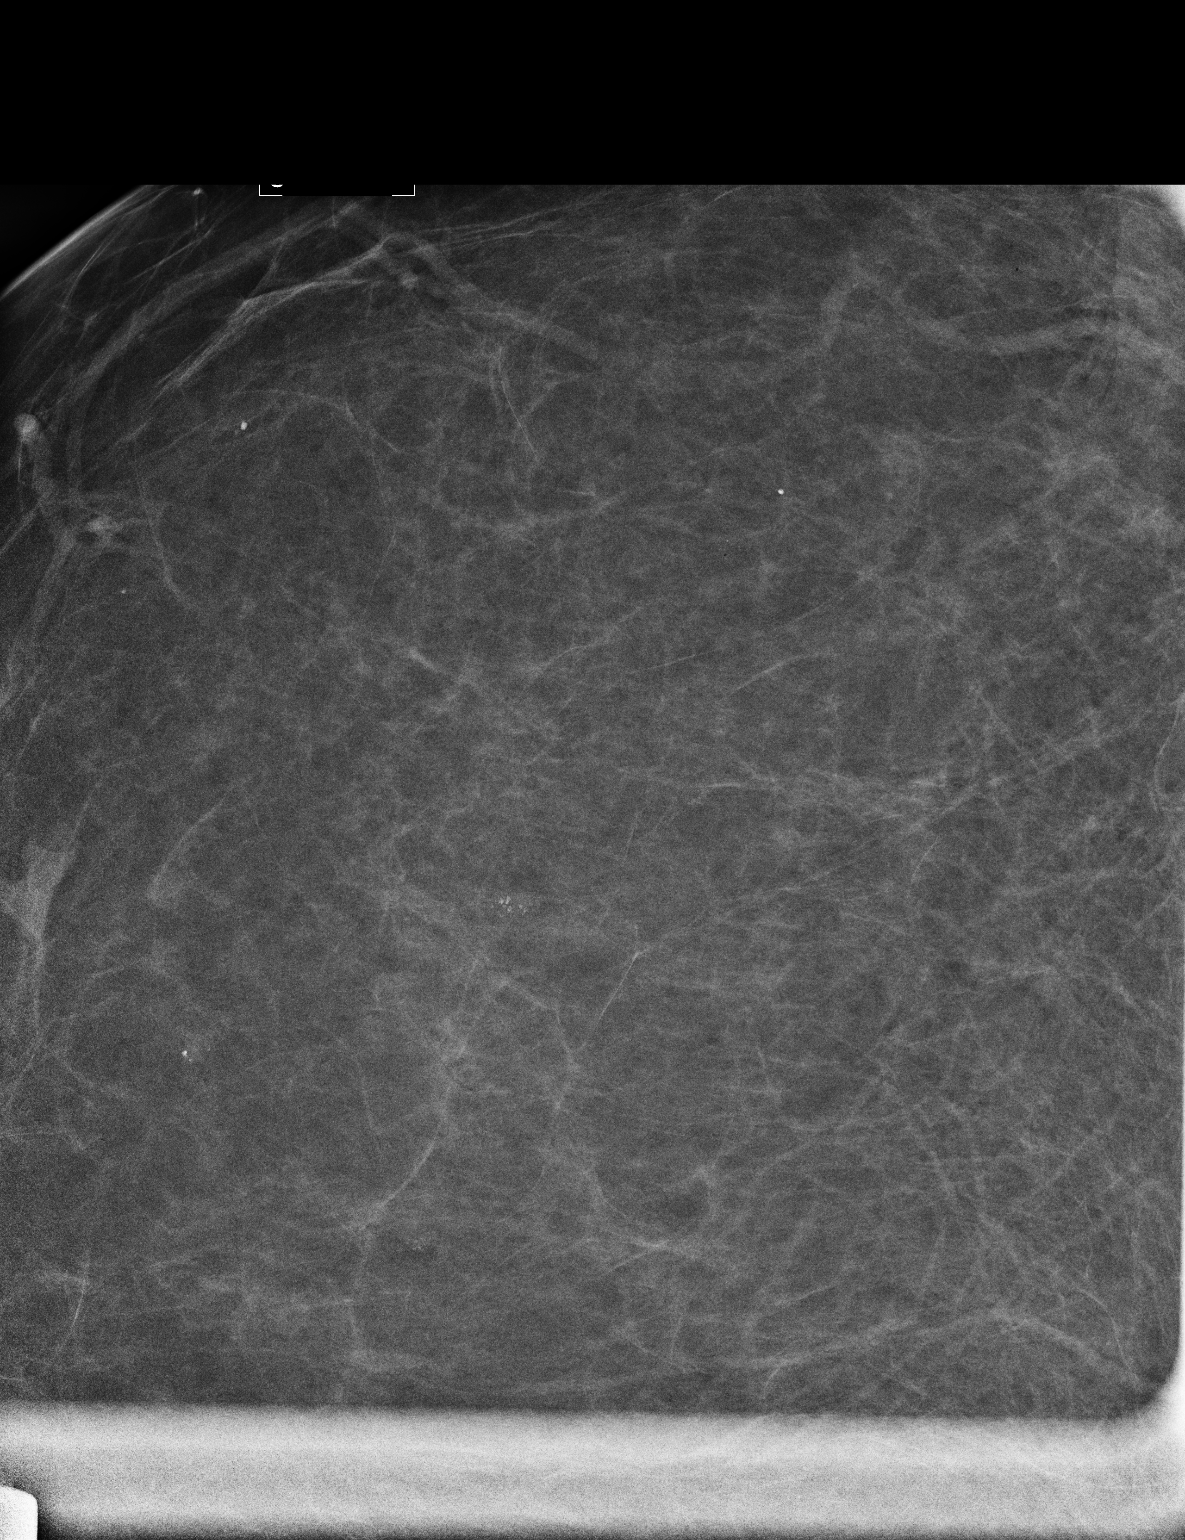

[R ML]
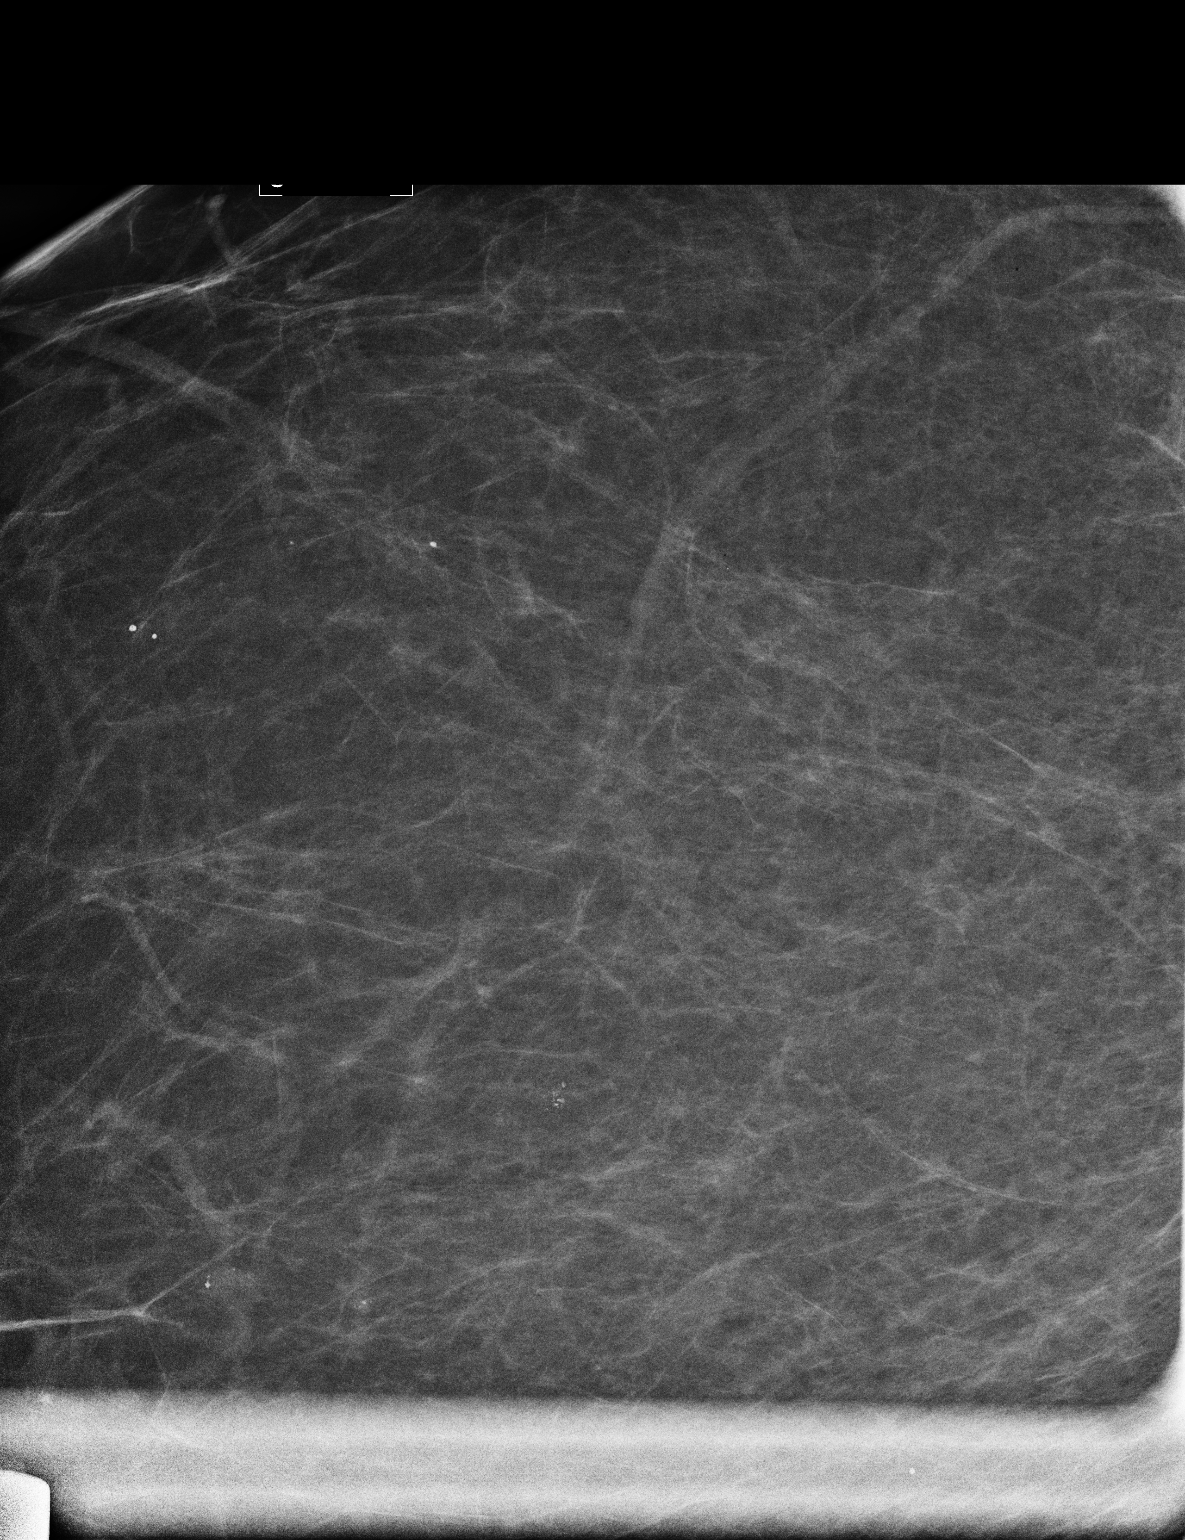

[2 of 2 positions shown; findings below may reference images not displayed]

ACR Breast Density Category b: There are scattered areas of
fibroglandular density.
FINDINGS: Spot compression magnification CC and ML views were performed of the
central right breast demonstrating a group of amorphous
calcifications spanning a distance of approximately 3-4 mm.
IMPRESSION: Suspicious right breast calcifications.

RECOMMENDATION:
Stereotactic guided biopsy of the suspicious calcifications in the
central right breast is recommended.

The patient states she has difficulty making time for appointments
due to the need to care for her [AGE] mother, and the
procedure will be scheduled at the patient's convenience.

I have discussed the findings and recommendations with the patient.
Results were also provided in writing at the conclusion of the
visit. If applicable, a reminder letter will be sent to the patient
regarding the next appointment.

BI-RADS CATEGORY  4: Suspicious.

## 2016-09-08 DIAGNOSIS — Z23 Encounter for immunization: Secondary | ICD-10-CM | POA: Diagnosis not present

## 2016-09-08 DIAGNOSIS — J329 Chronic sinusitis, unspecified: Secondary | ICD-10-CM | POA: Diagnosis not present

## 2016-09-08 DIAGNOSIS — J029 Acute pharyngitis, unspecified: Secondary | ICD-10-CM | POA: Diagnosis not present

## 2016-09-08 DIAGNOSIS — I10 Essential (primary) hypertension: Secondary | ICD-10-CM | POA: Diagnosis not present

## 2016-09-08 DIAGNOSIS — Z6822 Body mass index (BMI) 22.0-22.9, adult: Secondary | ICD-10-CM | POA: Diagnosis not present

## 2016-09-15 DIAGNOSIS — Z6822 Body mass index (BMI) 22.0-22.9, adult: Secondary | ICD-10-CM | POA: Diagnosis not present

## 2016-09-15 DIAGNOSIS — I10 Essential (primary) hypertension: Secondary | ICD-10-CM | POA: Diagnosis not present

## 2016-09-15 DIAGNOSIS — E782 Mixed hyperlipidemia: Secondary | ICD-10-CM | POA: Diagnosis not present

## 2016-09-15 DIAGNOSIS — J329 Chronic sinusitis, unspecified: Secondary | ICD-10-CM | POA: Diagnosis not present

## 2016-11-08 DIAGNOSIS — Z1389 Encounter for screening for other disorder: Secondary | ICD-10-CM | POA: Diagnosis not present

## 2016-11-08 DIAGNOSIS — Z0000 Encounter for general adult medical examination without abnormal findings: Secondary | ICD-10-CM | POA: Diagnosis not present

## 2016-11-08 DIAGNOSIS — E782 Mixed hyperlipidemia: Secondary | ICD-10-CM | POA: Diagnosis not present

## 2016-11-08 DIAGNOSIS — Z6822 Body mass index (BMI) 22.0-22.9, adult: Secondary | ICD-10-CM | POA: Diagnosis not present

## 2016-11-08 DIAGNOSIS — I10 Essential (primary) hypertension: Secondary | ICD-10-CM | POA: Diagnosis not present

## 2016-11-24 DIAGNOSIS — H52223 Regular astigmatism, bilateral: Secondary | ICD-10-CM | POA: Diagnosis not present

## 2016-11-24 DIAGNOSIS — H5203 Hypermetropia, bilateral: Secondary | ICD-10-CM | POA: Diagnosis not present

## 2016-11-24 DIAGNOSIS — H11001 Unspecified pterygium of right eye: Secondary | ICD-10-CM | POA: Diagnosis not present

## 2016-11-24 DIAGNOSIS — H524 Presbyopia: Secondary | ICD-10-CM | POA: Diagnosis not present

## 2016-12-27 DIAGNOSIS — H5203 Hypermetropia, bilateral: Secondary | ICD-10-CM | POA: Diagnosis not present

## 2016-12-27 DIAGNOSIS — H25813 Combined forms of age-related cataract, bilateral: Secondary | ICD-10-CM | POA: Diagnosis not present

## 2016-12-27 DIAGNOSIS — H11001 Unspecified pterygium of right eye: Secondary | ICD-10-CM | POA: Diagnosis not present

## 2016-12-27 DIAGNOSIS — H11002 Unspecified pterygium of left eye: Secondary | ICD-10-CM | POA: Diagnosis not present

## 2016-12-28 DIAGNOSIS — H25812 Combined forms of age-related cataract, left eye: Secondary | ICD-10-CM | POA: Diagnosis not present

## 2017-01-08 NOTE — Patient Instructions (Signed)
Riki AltesJudy S Busic  01/08/2017     @PREFPERIOPPHARMACY @   Your procedure is scheduled on 01/15/2017.  [facility]  Call this number if you have problems the morning of surgery:  [phone]   Remember:  Do not eat food or drink liquids after midnight.  Take these medicines the morning of surgery with A SIP OF WATER None   Do not wear jewelry, make-up or nail polish.  Do not wear lotions, powders, or perfumes, or deoderant.  Do not shave 48 hours prior to surgery.  Men may shave face and neck.  Do not bring valuables to the hospital.  [facility] is not responsible for any belongings or valuables.  Contacts, dentures or bridgework may not be worn into surgery.  Leave your suitcase in the car.  After surgery it may be brought to your room.  For patients admitted to the hospital, discharge time will be determined by your treatment team.  Patients discharged the day of surgery will not be allowed to drive home.    Please read over the following fact sheets that you were given. Anesthesia Post-op Instructions     PATIENT INSTRUCTIONS POST-ANESTHESIA  IMMEDIATELY FOLLOWING SURGERY:  Do not drive or operate machinery for the first twenty four hours after surgery.  Do not make any important decisions for twenty four hours after surgery or while taking narcotic pain medications or sedatives.  If you develop intractable nausea and vomiting or a severe headache please notify your doctor immediately.  FOLLOW-UP:  Please make an appointment with your surgeon as instructed. You do not need to follow up with anesthesia unless specifically instructed to do so.  WOUND CARE INSTRUCTIONS (if applicable):  Keep a dry clean dressing on the anesthesia/puncture wound site if there is drainage.  Once the wound has quit draining you may leave it open to air.  Generally you should leave the bandage intact for twenty four hours unless there is drainage.  If the epidural site drains  for more than 36-48 hours please call the anesthesia department.  QUESTIONS?:  Please feel free to call your physician or the hospital operator if you have any questions, and they will be happy to assist you.      Cataract Surgery Cataract surgery is a procedure to remove a cataract from your eye. A cataract is cloudiness on the lens of your eye. The lens focuses light inside the eye. When a lens becomes cloudy, your vision is affected. Cataract surgery is a procedure to remove the cloudy lens. A substitute lens (intraocular lens or IOL) is usually inserted as a replacement for the cloudy lens. Tell a health care provider about:  Any allergies you have.  All medicines you are taking, including vitamins, herbs, eye drops, creams, and over-the-counter medicines.  Any problems you or family members have had with anesthetic medicines.  Any blood disorders you have.  Any surgeries you have had, especially eye surgeries that include refractive surgery, such as PRK and LASIK.  Any medical conditions you have.  Whether you are pregnant or may be pregnant. What are the risks? Generally, this is a safe procedure. However, problems may occur, including:  Infection.  Bleeding.  Glaucoma.  Retinal detachment.  Allergic reactions to medicines.  Damage to other structures or organs.  Inflammation of the eye.  Clouding of the part of your eye that holds an IOL in place (after-cataract), if an IOL was inserted. This is fairly common.  An IOL moving out of position, if an IOL was inserted. This is very rare.  Loss of vision. This is rare. What happens before the procedure?  Follow instructions from your health care provider about eating or drinking restrictions.  Ask your health care provider about:  Changing or stopping your regular medicines, including any eye drops you have been prescribed. This is especially important if you are taking diabetes medicines or blood  thinners.  Taking medicines such as aspirin and ibuprofen. These medicines can thin your blood. Do not take these medicines before your procedure if your health care provider instructs you not to.  Do not put contact lenses in either eye on the day of your surgery.  Plan for someone to drive you to and from the procedure.  If you will be going home right after the procedure, plan to have someone with you for 24 hours. What happens during the procedure?  An IV tube may be inserted into one of your veins.  You will be given one or more of the following:  A medicine to help you relax (sedative).  A medicine to numb the area (local anesthetic). This may be numbing eye drops or an injection that is given behind the eye.  A small cut (incision) will be made to the edge of the clear, dome-shaped surface that covers the front of the eye (cornea).  A small probe will be inserted into the eye. This device gives off ultrasound waves that soften and break up the cloudy center of the lens. This makes it easier for the cloudy lens to be removed by suction.  An IOL may be implanted.  Part of the capsule that surrounds the lens will be left in the eye to support the IOL.  Your surgeon may use stitches (sutures) to close the incision. The procedure may vary among health care providers and hospitals. What happens after the procedure?  Your blood pressure, heart rate, breathing rate, and blood oxygen level will be monitored often until the medicines you were given have worn off.  You may be given a protective shield to wear over your eyes.  Do not drive for 24 hours if you received a sedative. This information is not intended to replace advice given to you by your health care provider. Make sure you discuss any questions you have with your health care provider. Document Released: 11/30/2011 Document Revised: 05/18/2016 Document Reviewed: 10/21/2015 Elsevier Interactive Patient Education  2017  [employer].

## 2017-01-10 ENCOUNTER — Encounter (HOSPITAL_COMMUNITY)
Admission: RE | Admit: 2017-01-10 | Discharge: 2017-01-10 | Disposition: A | Discharge status: Home / Self Care | Payer: Medicare Other | Source: Ambulatory Visit | Attending: Ophthalmology | Admitting: Ophthalmology

## 2017-01-10 ENCOUNTER — Encounter (HOSPITAL_COMMUNITY): Payer: Self-pay

## 2017-01-10 DIAGNOSIS — Z01818 Encounter for other preprocedural examination: Secondary | ICD-10-CM | POA: Diagnosis not present

## 2017-01-10 DIAGNOSIS — H2512 Age-related nuclear cataract, left eye: Secondary | ICD-10-CM | POA: Insufficient documentation

## 2017-01-10 DIAGNOSIS — Z01812 Encounter for preprocedural laboratory examination: Secondary | ICD-10-CM | POA: Diagnosis not present

## 2017-01-10 HISTORY — DX: Essential (primary) hypertension: I10

## 2017-01-10 HISTORY — DX: Unspecified osteoarthritis, unspecified site: M19.90

## 2017-01-10 LAB — CBC
HCT: 38.6 % (ref 36.0–46.0)
Hemoglobin: 13 g/dL (ref 12.0–15.0)
MCH: 28.5 pg (ref 26.0–34.0)
MCHC: 33.7 g/dL (ref 30.0–36.0)
MCV: 84.6 fL (ref 78.0–100.0)
Platelets: 213 10*3/uL (ref 150–400)
RBC: 4.56 MIL/uL (ref 3.87–5.11)
RDW: 15.2 % (ref 11.5–15.5)
WBC: 5.8 10*3/uL (ref 4.0–10.5)

## 2017-01-10 LAB — BASIC METABOLIC PANEL
Anion gap: 9 (ref 5–15)
BUN: 32 mg/dL — AB (ref 6–20)
CO2: 27 mmol/L (ref 22–32)
Calcium: 9.2 mg/dL (ref 8.9–10.3)
Chloride: 102 mmol/L (ref 101–111)
Creatinine, Ser: 0.63 mg/dL (ref 0.44–1.00)
GFR calc Af Amer: >60 mL/min (ref >60)
GFR calc non Af Amer: >60 mL/min (ref >60)
Glucose, Bld: 111 mg/dL — ABNORMAL HIGH (ref 65–99)
POTASSIUM: 3.6 mmol/L (ref 3.5–5.1)
Sodium: 138 mmol/L (ref 135–145)

## 2017-01-15 ENCOUNTER — Encounter (HOSPITAL_COMMUNITY): Payer: Self-pay | Admitting: *Deleted

## 2017-01-15 ENCOUNTER — Ambulatory Visit (HOSPITAL_COMMUNITY): Payer: Medicare Other | Admitting: Anesthesiology

## 2017-01-15 ENCOUNTER — Ambulatory Visit (HOSPITAL_COMMUNITY)
Admission: RE | Admit: 2017-01-15 | Discharge: 2017-01-15 | Disposition: A | Discharge status: Home / Self Care | Payer: Medicare Other | Source: Ambulatory Visit | Attending: Ophthalmology | Admitting: Ophthalmology

## 2017-01-15 ENCOUNTER — Encounter (HOSPITAL_COMMUNITY)
Admission: RE | Disposition: A | Discharge status: Home / Self Care | Payer: Self-pay | Source: Ambulatory Visit | Attending: Ophthalmology

## 2017-01-15 DIAGNOSIS — I10 Essential (primary) hypertension: Secondary | ICD-10-CM | POA: Diagnosis not present

## 2017-01-15 DIAGNOSIS — H25812 Combined forms of age-related cataract, left eye: Secondary | ICD-10-CM | POA: Diagnosis not present

## 2017-01-15 DIAGNOSIS — H2512 Age-related nuclear cataract, left eye: Secondary | ICD-10-CM | POA: Diagnosis not present

## 2017-01-15 HISTORY — PX: CATARACT EXTRACTION W/PHACO: SHX586

## 2017-01-15 SURGERY — PHACOEMULSIFICATION, CATARACT, WITH IOL INSERTION
Anesthesia: Monitor Anesthesia Care | Site: Eye | Laterality: Left

## 2017-01-15 MED ORDER — POVIDONE-IODINE 5 % OP SOLN
OPHTHALMIC | Status: DC | PRN
  Administered 2017-01-15: 1 via OPHTHALMIC

## 2017-01-15 MED ORDER — FENTANYL CITRATE (PF) 100 MCG/2ML IJ SOLN
25.0000 ug | INTRAMUSCULAR | Status: DC | PRN
  Administered 2017-01-15: 25 ug via INTRAVENOUS

## 2017-01-15 MED ORDER — CYCLOPENTOLATE-PHENYLEPHRINE 0.2-1 % OP SOLN
1.0000 [drp] | OPHTHALMIC | Status: AC
  Administered 2017-01-15 (×3): 1 [drp] via OPHTHALMIC

## 2017-01-15 MED ORDER — LIDOCAINE 3.5 % OP GEL OPTIME - NO CHARGE
OPHTHALMIC | Status: DC | PRN
  Administered 2017-01-15: 1 [drp] via OPHTHALMIC

## 2017-01-15 MED ORDER — NEOMYCIN-POLYMYXIN-DEXAMETH 3.5-10000-0.1 OP SUSP
OPHTHALMIC | Status: DC | PRN
  Administered 2017-01-15: 2 [drp]

## 2017-01-15 MED ORDER — PROVISC 10 MG/ML IO SOLN
INTRAOCULAR | Status: DC | PRN
  Administered 2017-01-15: 0.85 mL via INTRAOCULAR

## 2017-01-15 MED ORDER — EPINEPHRINE PF 1 MG/ML IJ SOLN
INTRAMUSCULAR | Status: AC
  Filled 2017-01-15: qty 1

## 2017-01-15 MED ORDER — LACTATED RINGERS IV SOLN
INTRAVENOUS | Status: DC
  Administered 2017-01-15: 1000 mL via INTRAVENOUS

## 2017-01-15 MED ORDER — PHENYLEPHRINE HCL 2.5 % OP SOLN
1.0000 [drp] | OPHTHALMIC | Status: AC
  Administered 2017-01-15 (×3): 1 [drp] via OPHTHALMIC

## 2017-01-15 MED ORDER — FENTANYL CITRATE (PF) 100 MCG/2ML IJ SOLN
INTRAMUSCULAR | Status: AC
  Filled 2017-01-15: qty 2

## 2017-01-15 MED ORDER — MIDAZOLAM HCL 2 MG/2ML IJ SOLN
0.5000 mg | INTRAMUSCULAR | Status: DC | PRN
  Administered 2017-01-15: 2 mg via INTRAVENOUS

## 2017-01-15 MED ORDER — LIDOCAINE HCL (PF) 1 % IJ SOLN
INTRAMUSCULAR | Status: DC | PRN
  Administered 2017-01-15: .6 mL

## 2017-01-15 MED ORDER — BSS IO SOLN
INTRAOCULAR | Status: DC | PRN
  Administered 2017-01-15: 15 mL via INTRAOCULAR

## 2017-01-15 MED ORDER — LIDOCAINE HCL 3.5 % OP GEL
1.0000 "application " | Freq: Once | OPHTHALMIC | Status: AC
  Administered 2017-01-15: 1 via OPHTHALMIC

## 2017-01-15 MED ORDER — TETRACAINE HCL 0.5 % OP SOLN
1.0000 [drp] | OPHTHALMIC | Status: AC
  Administered 2017-01-15 (×3): 1 [drp] via OPHTHALMIC

## 2017-01-15 MED ORDER — EPINEPHRINE PF 1 MG/ML IJ SOLN
INTRAOCULAR | Status: DC | PRN
  Administered 2017-01-15: 500 mL

## 2017-01-15 MED ORDER — MIDAZOLAM HCL 2 MG/2ML IJ SOLN
INTRAMUSCULAR | Status: AC
  Filled 2017-01-15: qty 2

## 2017-01-15 SURGICAL SUPPLY — 24 items
CAPSULAR TENSION RING-AMO (OPHTHALMIC RELATED) IMPLANT
CLOTH BEACON ORANGE TIMEOUT ST (SAFETY) ×3 IMPLANT
EYE SHIELD UNIVERSAL CLEAR (GAUZE/BANDAGES/DRESSINGS) ×3 IMPLANT
GLOVE BIOGEL PI IND STRL 6.5 (GLOVE) IMPLANT
GLOVE BIOGEL PI IND STRL 7.0 (GLOVE) ×1 IMPLANT
GLOVE BIOGEL PI INDICATOR 6.5 (GLOVE)
GLOVE BIOGEL PI INDICATOR 7.0 (GLOVE) ×2
GLOVE EXAM NITRILE LRG STRL (GLOVE) ×3 IMPLANT
GLOVE EXAM NITRILE MD LF STRL (GLOVE) IMPLANT
KIT VITRECTOMY (OPHTHALMIC RELATED) IMPLANT
LENS IOL IQ RESTOR TRC 22.0 ×1 IMPLANT
LENS IOL RESTOR TORIC 3D 22.0 ×3 IMPLANT
PAD ARMBOARD 7.5X6 YLW CONV (MISCELLANEOUS) ×3 IMPLANT
PROC W NO LENS (INTRAOCULAR LENS)
PROC W SPEC LENS (INTRAOCULAR LENS) ×3
PROCESS W NO LENS (INTRAOCULAR LENS) IMPLANT
PROCESS W SPEC LENS (INTRAOCULAR LENS) ×1 IMPLANT
RETRACTOR IRIS SIGHTPATH (OPHTHALMIC RELATED) IMPLANT
RING MALYGIN (MISCELLANEOUS) IMPLANT
SYRINGE LUER LOK 1CC (MISCELLANEOUS) ×3 IMPLANT
TAPE SURG TRANSPORE 1 IN (GAUZE/BANDAGES/DRESSINGS) ×1 IMPLANT
TAPE SURGICAL TRANSPORE 1 IN (GAUZE/BANDAGES/DRESSINGS) ×2
VISCOELASTIC ADDITIONAL (OPHTHALMIC RELATED) IMPLANT
WATER STERILE IRR 250ML POUR (IV SOLUTION) ×3 IMPLANT

## 2017-01-15 NOTE — Transfer of Care (Signed)
Immediate Anesthesia Transfer of Care Note  Patient: Brenda [name]  Procedure(s) Performed: Procedure(s) with comments: CATARACT EXTRACTION PHACO AND INTRAOCULAR LENS PLACEMENT (IOC) (Left) - cde-7.04  Patient Location: PACU  Anesthesia Type:MAC  Level of Consciousness: awake, alert , oriented and patient cooperative  Airway & Oxygen Therapy: Patient Spontanous Breathing  Post-op Assessment: Report given to RN and Post -op Vital signs reviewed and stable  Post vital signs: Reviewed and stable  Last Vitals:  Vitals:   01/15/17 1015 01/15/17 1020  BP: (!) 115/58 113/66  Pulse:    Resp: 16 18  Temp:      Last Pain:  Vitals:   01/15/17 0930  TempSrc: Oral      Patients Stated Pain Goal: 4 (88/50/27 7412)  Complications: No apparent anesthesia complications

## 2017-01-15 NOTE — H&P (Signed)
I have reviewed the H&P, the patient was re-examined, and I have identified no interval changes in medical condition and plan of care since the history and physical of record  

## 2017-01-15 NOTE — Discharge Instructions (Signed)
Moderate Conscious Sedation, Adult, Care After °These instructions provide you with information about caring for yourself after your procedure. Your health care provider may also give you more specific instructions. Your treatment has been planned according to current medical practices, but problems sometimes occur. Call your health care provider if you have any problems or questions after your procedure. °What can I expect after the procedure? °After your procedure, it is common: °· To feel sleepy for several hours. °· To feel clumsy and have poor balance for several hours. °· To have poor judgment for several hours. °· To vomit if you eat too soon. °Follow these instructions at home: °For at least 24 hours after the procedure:  ° °· Do not: °¨ Participate in activities where you could fall or become injured. °¨ Drive. °¨ Use heavy machinery. °¨ Drink alcohol. °¨ Take sleeping pills or medicines that cause drowsiness. °¨ Make important decisions or sign legal documents. °¨ Take care of children on your own. °· Rest. °Eating and drinking  °· Follow the diet recommended by your health care provider. °· If you vomit: °¨ Drink water, juice, or soup when you can drink without vomiting. °¨ Make sure you have little or no nausea before eating solid foods. °General instructions  °· Have a responsible adult stay with you until you are awake and alert. °· Take over-the-counter and prescription medicines only as told by your health care provider. °· If you smoke, do not smoke without supervision. °· Keep all follow-up visits as told by your health care provider. This is important. °Contact a health care provider if: °· You keep feeling nauseous or you keep vomiting. °· You feel light-headed. °· You develop a rash. °· You have a fever. °Get help right away if: °· You have trouble breathing. °This information is not intended to replace advice given to you by your health care provider. Make sure you discuss any questions you  have with your health care provider. °Document Released: 10/01/2013 Document Revised: 05/15/2016 Document Reviewed: 04/01/2016 °Elsevier Interactive Patient Education © 2017 [employer] ° °

## 2017-01-15 NOTE — Anesthesia Preprocedure Evaluation (Signed)
Anesthesia Evaluation  Patient identified by MRN, date of birth, ID band Patient awake    Reviewed: Allergy & Precautions, NPO status , Patient's Chart, lab work & pertinent test results  Airway Mallampati: I  TM Distance: >3 FB     Dental  (+) Teeth Intact   Pulmonary neg pulmonary ROS,    breath sounds clear to auscultation       Cardiovascular hypertension, Pt. on medications  Rhythm:Regular Rate:Normal     Neuro/Psych    GI/Hepatic negative GI ROS,   Endo/Other    Renal/GU      Musculoskeletal   Abdominal   Peds  Hematology   Anesthesia Other Findings   Reproductive/Obstetrics                             Anesthesia Physical Anesthesia Plan  ASA: II  Anesthesia Plan: MAC   Post-op Pain Management:    Induction: Intravenous  Airway Management Planned: Nasal Cannula  Additional Equipment:   Intra-op Plan:   Post-operative Plan:   Informed Consent: I have reviewed the patients History and Physical, chart, labs and discussed the procedure including the risks, benefits and alternatives for the proposed anesthesia with the patient or authorized representative who has indicated his/her understanding and acceptance.     Plan Discussed with:   Anesthesia Plan Comments:         Anesthesia Quick Evaluation  

## 2017-01-15 NOTE — Op Note (Signed)
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  Assistants: None  Anesthesia: Topical with MAC  Indications: Painless, progressive loss of vision with compromise of daily activities.  Surgery: Cataract Extraction with Intraocular lens Implant Left Eye  Discription: The patient had dilating drops and viscous lidocaine placed into the Left in the pre-op holding area. In the sitting position horizontal reference marks were made on the cornea.  After transfer to the operating room, a time out was performed. The patient was then prepped and draped. Beginning with a 31 degree blade a paracentesis port was made at the surgeon's 2 o'clock position. The anterior chamber was then filled with 1% non-preserved lidocaine. This was followed by filling the anterior chamber with Provisc. A 2.38m keratome blade was used to make a clear cornea incision at the temporal limbus. A bent cystatome needle was used to create a continuous tear capsulotomy. Hydrodissection was performed with balanced salt solution on a Fine canula. The lens nucleus was then removed using the phacoemulsification handpiece. Residual cortex was removed with the I&A handpiece. The anterior chamber and capsular bag were refilled with Provisc. A posterior chamber intraocular lens was placed into the capsular bag with it's injector. Additional corneal marks were made on the 091/271 degree meridians.  The Provisc was then removed from the anterior chamber and capsular bag with the I&A handpiece. The implant was positioned with the Kuglan hook. Stromal hydration of the main incision and paracentesis port was performed with BSS on a Fine canula. The wounds were tested for leak which was negative. The patient tolerated the procedure well. There were no operative complications. The  patient was then transferred to the recovery room in stable condition.  Complications: None  Specimen: None  EBL: None  Prosthetic device: Alcon AcrySof ReStor Toric [id number], power 21.5,  SN [id number]

## 2017-01-15 NOTE — Anesthesia Postprocedure Evaluation (Signed)
Anesthesia Post Note  Patient: NANIE DUNKLEBERGER  Procedure(s) Performed: Procedure(s) (LRB): CATARACT EXTRACTION PHACO AND INTRAOCULAR LENS PLACEMENT (IOC) (Left)  Patient location during evaluation: PACU Anesthesia Type: MAC Level of consciousness: awake and alert and oriented Pain management: pain level controlled Vital Signs Assessment: post-procedure vital signs reviewed and stable Respiratory status: spontaneous breathing Cardiovascular status: blood pressure returned to baseline and stable Postop Assessment: no headache, adequate PO intake and no backache Anesthetic complications: no     Last Vitals:  Vitals:   01/15/17 1015 01/15/17 1020  BP: (!) 115/58 113/66  Pulse:    Resp: 16 18  Temp:      Last Pain:  Vitals:   01/15/17 0930  TempSrc: Oral                 Kareen Jefferys

## 2017-01-16 ENCOUNTER — Encounter (HOSPITAL_COMMUNITY): Payer: Self-pay | Admitting: Ophthalmology

## 2017-01-22 DIAGNOSIS — Z961 Presence of intraocular lens: Secondary | ICD-10-CM | POA: Diagnosis not present

## 2017-01-22 DIAGNOSIS — H25811 Combined forms of age-related cataract, right eye: Secondary | ICD-10-CM | POA: Diagnosis not present

## 2017-01-24 ENCOUNTER — Other Ambulatory Visit (HOSPITAL_COMMUNITY): Payer: Self-pay | Admitting: Internal Medicine

## 2017-01-24 DIAGNOSIS — Z1231 Encounter for screening mammogram for malignant neoplasm of breast: Secondary | ICD-10-CM

## 2017-01-26 ENCOUNTER — Ambulatory Visit (HOSPITAL_COMMUNITY)
Admission: RE | Admit: 2017-01-26 | Discharge: 2017-01-26 | Disposition: A | Discharge status: Home / Self Care | Payer: Medicare Other | Source: Ambulatory Visit | Attending: Internal Medicine | Admitting: Internal Medicine

## 2017-01-26 DIAGNOSIS — Z1231 Encounter for screening mammogram for malignant neoplasm of breast: Secondary | ICD-10-CM | POA: Insufficient documentation

## 2017-01-30 ENCOUNTER — Encounter (HOSPITAL_COMMUNITY): Payer: Self-pay

## 2017-01-30 ENCOUNTER — Encounter (HOSPITAL_COMMUNITY)
Admission: RE | Admit: 2017-01-30 | Discharge: 2017-01-30 | Disposition: A | Discharge status: Home / Self Care | Payer: Medicare Other | Source: Ambulatory Visit | Attending: Ophthalmology | Admitting: Ophthalmology

## 2017-02-05 ENCOUNTER — Ambulatory Visit (HOSPITAL_COMMUNITY): Payer: Medicare Other | Admitting: Anesthesiology

## 2017-02-05 ENCOUNTER — Encounter (HOSPITAL_COMMUNITY)
Admission: RE | Disposition: A | Discharge status: Home / Self Care | Payer: Self-pay | Source: Ambulatory Visit | Attending: Ophthalmology

## 2017-02-05 ENCOUNTER — Encounter (HOSPITAL_COMMUNITY): Payer: Self-pay | Admitting: Ophthalmology

## 2017-02-05 ENCOUNTER — Ambulatory Visit (HOSPITAL_COMMUNITY)
Admission: RE | Admit: 2017-02-05 | Discharge: 2017-02-05 | Disposition: A | Discharge status: Home / Self Care | Payer: Medicare Other | Source: Ambulatory Visit | Attending: Ophthalmology | Admitting: Ophthalmology

## 2017-02-05 DIAGNOSIS — H269 Unspecified cataract: Secondary | ICD-10-CM | POA: Diagnosis not present

## 2017-02-05 DIAGNOSIS — H25811 Combined forms of age-related cataract, right eye: Secondary | ICD-10-CM | POA: Insufficient documentation

## 2017-02-05 DIAGNOSIS — Z79899 Other long term (current) drug therapy: Secondary | ICD-10-CM | POA: Insufficient documentation

## 2017-02-05 DIAGNOSIS — I10 Essential (primary) hypertension: Secondary | ICD-10-CM | POA: Diagnosis not present

## 2017-02-05 HISTORY — PX: CATARACT EXTRACTION W/PHACO: SHX586

## 2017-02-05 SURGERY — PHACOEMULSIFICATION, CATARACT, WITH IOL INSERTION
Anesthesia: Monitor Anesthesia Care | Site: Eye | Laterality: Right

## 2017-02-05 MED ORDER — EPINEPHRINE PF 1 MG/ML IJ SOLN
INTRAMUSCULAR | Status: AC
  Filled 2017-02-05: qty 1

## 2017-02-05 MED ORDER — MIDAZOLAM HCL 2 MG/2ML IJ SOLN
1.0000 mg | INTRAMUSCULAR | Status: AC
  Administered 2017-02-05 (×2): 1 mg via INTRAVENOUS

## 2017-02-05 MED ORDER — MIDAZOLAM HCL 2 MG/2ML IJ SOLN
INTRAMUSCULAR | Status: AC
  Filled 2017-02-05: qty 2

## 2017-02-05 MED ORDER — NEOMYCIN-POLYMYXIN-DEXAMETH 3.5-10000-0.1 OP SUSP
OPHTHALMIC | Status: DC | PRN
  Administered 2017-02-05: 2 [drp] via OPHTHALMIC

## 2017-02-05 MED ORDER — FENTANYL CITRATE (PF) 100 MCG/2ML IJ SOLN
INTRAMUSCULAR | Status: AC
  Filled 2017-02-05: qty 2

## 2017-02-05 MED ORDER — TETRACAINE HCL 0.5 % OP SOLN
1.0000 [drp] | OPHTHALMIC | Status: AC
  Administered 2017-02-05 (×3): 1 [drp] via OPHTHALMIC

## 2017-02-05 MED ORDER — CYCLOPENTOLATE-PHENYLEPHRINE 0.2-1 % OP SOLN
1.0000 [drp] | OPHTHALMIC | Status: AC
  Administered 2017-02-05 (×3): 1 [drp] via OPHTHALMIC

## 2017-02-05 MED ORDER — EPINEPHRINE PF 1 MG/ML IJ SOLN
INTRAOCULAR | Status: DC | PRN
  Administered 2017-02-05: 500 mL

## 2017-02-05 MED ORDER — LACTATED RINGERS IV SOLN
INTRAVENOUS | Status: DC
  Administered 2017-02-05: 09:00:00 via INTRAVENOUS

## 2017-02-05 MED ORDER — PROVISC 10 MG/ML IO SOLN
INTRAOCULAR | Status: DC | PRN
  Administered 2017-02-05: 0.85 mL via INTRAOCULAR

## 2017-02-05 MED ORDER — FENTANYL CITRATE (PF) 100 MCG/2ML IJ SOLN
25.0000 ug | INTRAMUSCULAR | Status: AC
  Administered 2017-02-05 (×2): 25 ug via INTRAVENOUS

## 2017-02-05 MED ORDER — LIDOCAINE HCL 3.5 % OP GEL
1.0000 "application " | Freq: Once | OPHTHALMIC | Status: AC
  Administered 2017-02-05: 1 via OPHTHALMIC

## 2017-02-05 MED ORDER — LIDOCAINE 3.5 % OP GEL OPTIME - NO CHARGE
OPHTHALMIC | Status: DC | PRN
  Administered 2017-02-05: 2 [drp] via OPHTHALMIC

## 2017-02-05 MED ORDER — PHENYLEPHRINE HCL 2.5 % OP SOLN
1.0000 [drp] | OPHTHALMIC | Status: AC | PRN
  Administered 2017-02-05 (×3): 1 [drp] via OPHTHALMIC

## 2017-02-05 MED ORDER — LIDOCAINE HCL (PF) 1 % IJ SOLN
INTRAMUSCULAR | Status: DC | PRN
  Administered 2017-02-05: .4 mL

## 2017-02-05 MED ORDER — BSS IO SOLN
INTRAOCULAR | Status: DC | PRN
  Administered 2017-02-05: 15 mL via INTRAOCULAR

## 2017-02-05 MED ORDER — POVIDONE-IODINE 5 % OP SOLN
OPHTHALMIC | Status: DC | PRN
  Administered 2017-02-05: 1 via OPHTHALMIC

## 2017-02-05 SURGICAL SUPPLY — 23 items
CAPSULAR TENSION RING-AMO (OPHTHALMIC RELATED) IMPLANT
CLOTH BEACON ORANGE TIMEOUT ST (SAFETY) ×3 IMPLANT
EYE SHIELD UNIVERSAL CLEAR (GAUZE/BANDAGES/DRESSINGS) ×3 IMPLANT
GLOVE BIOGEL PI IND STRL 6.5 (GLOVE) IMPLANT
GLOVE BIOGEL PI IND STRL 7.0 (GLOVE) ×2 IMPLANT
GLOVE BIOGEL PI INDICATOR 6.5 (GLOVE)
GLOVE BIOGEL PI INDICATOR 7.0 (GLOVE) ×4
GLOVE EXAM NITRILE LRG STRL (GLOVE) IMPLANT
GLOVE EXAM NITRILE MD LF STRL (GLOVE) IMPLANT
KIT VITRECTOMY (OPHTHALMIC RELATED) IMPLANT
LENS INTRAOCULAR RESTOR ×3 IMPLANT
PAD ARMBOARD 7.5X6 YLW CONV (MISCELLANEOUS) ×3 IMPLANT
PROC W NO LENS (INTRAOCULAR LENS)
PROC W SPEC LENS (INTRAOCULAR LENS) ×3
PROCESS W NO LENS (INTRAOCULAR LENS) IMPLANT
PROCESS W SPEC LENS (INTRAOCULAR LENS) ×1 IMPLANT
RETRACTOR IRIS SIGHTPATH (OPHTHALMIC RELATED) IMPLANT
RING MALYGIN (MISCELLANEOUS) IMPLANT
SYRINGE LUER LOK 1CC (MISCELLANEOUS) ×3 IMPLANT
TAPE SURG TRANSPORE 1 IN (GAUZE/BANDAGES/DRESSINGS) ×1 IMPLANT
TAPE SURGICAL TRANSPORE 1 IN (GAUZE/BANDAGES/DRESSINGS) ×2
VISCOELASTIC ADDITIONAL (OPHTHALMIC RELATED) IMPLANT
WATER STERILE IRR 250ML POUR (IV SOLUTION) ×3 IMPLANT

## 2017-02-05 NOTE — Transfer of Care (Signed)
Immediate Anesthesia Transfer of Care Note  Patient: Brenda [name]  Procedure(s) Performed: Procedure(s) with comments: CATARACT EXTRACTION PHACO AND INTRAOCULAR LENS PLACEMENT RIGHT EYE (Right) - CDE:  6.01  Patient Location: Short Stay  Anesthesia Type:MAC  Level of Consciousness: awake and patient cooperative  Airway & Oxygen Therapy: Patient Spontanous Breathing  Post-op Assessment: Report given to RN and Post -op Vital signs reviewed and stable  Post vital signs: Reviewed and stable  Last Vitals:  Vitals:   02/05/17 0935 02/05/17 0940  BP:    Resp: (!) 26 (!) 41  Temp:      Last Pain: There were no vitals filed for this visit.       Complications: No apparent anesthesia complications

## 2017-02-05 NOTE — Anesthesia Postprocedure Evaluation (Signed)
Anesthesia Post Note  Patient: Brenda [name]  Procedure(s) Performed: Procedure(s) (LRB): CATARACT EXTRACTION PHACO AND INTRAOCULAR LENS PLACEMENT RIGHT EYE (Right)  Patient location during evaluation: Short Stay Anesthesia Type: MAC Level of consciousness: awake and alert, oriented and patient cooperative Pain management: pain level controlled Vital Signs Assessment: post-procedure vital signs reviewed and stable Respiratory status: spontaneous breathing, nonlabored ventilation and respiratory function stable Cardiovascular status: blood pressure returned to baseline Postop Assessment: no signs of nausea or vomiting Anesthetic complications: no     Last Vitals:  Vitals:   02/05/17 0935 02/05/17 0940  BP:    Resp: (!) 26 (!) 41  Temp:      Last Pain: There were no vitals filed for this visit.               Krystena Reitter J

## 2017-02-05 NOTE — Discharge Instructions (Signed)

## 2017-02-05 NOTE — H&P (Signed)
I have reviewed the H&P, the patient was re-examined, and I have identified no interval changes in medical condition and plan of care since the history and physical of record  

## 2017-02-05 NOTE — Op Note (Signed)
Date of Admission: 02/05/2017  Date of Surgery: 02/05/2017  Pre-Op Dx: Cataract Right  Eye  Post-Op Dx: Senile Combined Cataract  Right  Eye,  Dx Code I45.809  Surgeon: Tonny Branch, M.D.  Assistants: None  Anesthesia: Topical with MAC  Indications: Painless, progressive loss of vision with compromise of daily activities.  Surgery: Cataract Extraction with Intraocular lens Implant Right Eye  Discription: The patient had dilating drops and viscous lidocaine placed into the Right eye in the pre-op holding area. After transfer to the operating room, a time out was performed. The patient was then prepped and draped. Beginning with a 79 degree blade a paracentesis port was made at the surgeon's 2 o'clock position. The anterior chamber was then filled with 1% non-preserved lidocaine. This was followed by filling the anterior chamber with Provisc.  A 2.54m keratome blade was used to make a clear corneal incision at the temporal limbus.  A bent cystatome needle was used to create a continuous tear capsulotomy. Hydrodissection was performed with balanced salt solution on a Fine canula. The lens nucleus was then removed using the phacoemulsification handpiece. Residual cortex was removed with the I&A handpiece. The anterior chamber and capsular bag were refilled with Provisc. A posterior chamber intraocular lens was placed into the capsular bag with it's injector. The implant was positioned with the Kuglan hook. The Provisc was then removed from the anterior chamber and capsular bag with the I&A handpiece. Stromal hydration of the main incision and paracentesis port was performed with BSS on a Fine canula. The wounds were tested for leak which was negative. The patient tolerated the procedure well. There were no operative complications. The patient was then transferred to the recovery room in stable condition.  Complications: None  Specimen: None  EBL: None  Prosthetic device: Alcon ReStor [id number], power  21.5, SN [id number]

## 2017-02-05 NOTE — Anesthesia Preprocedure Evaluation (Signed)
Anesthesia Evaluation  Patient identified by MRN, date of birth, ID band Patient awake    Reviewed: Allergy & Precautions, NPO status , Patient's Chart, lab work & pertinent test results  Airway Mallampati: I  TM Distance: >3 FB     Dental  (+) Teeth Intact   Pulmonary neg pulmonary ROS,    breath sounds clear to auscultation       Cardiovascular hypertension, Pt. on medications  Rhythm:Regular Rate:Normal     Neuro/Psych    GI/Hepatic negative GI ROS,   Endo/Other    Renal/GU      Musculoskeletal   Abdominal   Peds  Hematology   Anesthesia Other Findings   Reproductive/Obstetrics                             Anesthesia Physical Anesthesia Plan  ASA: II  Anesthesia Plan: MAC   Post-op Pain Management:    Induction: Intravenous  Airway Management Planned: Nasal Cannula  Additional Equipment:   Intra-op Plan:   Post-operative Plan:   Informed Consent: I have reviewed the patients History and Physical, chart, labs and discussed the procedure including the risks, benefits and alternatives for the proposed anesthesia with the patient or authorized representative who has indicated his/her understanding and acceptance.     Plan Discussed with:   Anesthesia Plan Comments:         Anesthesia Quick Evaluation  

## 2017-02-07 ENCOUNTER — Encounter (HOSPITAL_COMMUNITY): Payer: Self-pay | Admitting: Ophthalmology

## 2017-02-20 ENCOUNTER — Encounter (HOSPITAL_COMMUNITY): Payer: Self-pay | Admitting: Ophthalmology

## 2017-04-05 DIAGNOSIS — I10 Essential (primary) hypertension: Secondary | ICD-10-CM | POA: Diagnosis not present

## 2017-04-05 DIAGNOSIS — E782 Mixed hyperlipidemia: Secondary | ICD-10-CM | POA: Diagnosis not present

## 2017-04-05 DIAGNOSIS — Z6821 Body mass index (BMI) 21.0-21.9, adult: Secondary | ICD-10-CM | POA: Diagnosis not present

## 2017-04-05 DIAGNOSIS — Z1389 Encounter for screening for other disorder: Secondary | ICD-10-CM | POA: Diagnosis not present

## 2017-04-05 DIAGNOSIS — R070 Pain in throat: Secondary | ICD-10-CM | POA: Diagnosis not present

## 2017-04-05 DIAGNOSIS — J343 Hypertrophy of nasal turbinates: Secondary | ICD-10-CM | POA: Diagnosis not present

## 2017-04-05 DIAGNOSIS — J069 Acute upper respiratory infection, unspecified: Secondary | ICD-10-CM | POA: Diagnosis not present

## 2017-04-24 ENCOUNTER — Telehealth: Payer: Self-pay | Admitting: Gastroenterology

## 2017-04-24 NOTE — Telephone Encounter (Signed)
Recall for tcs °

## 2017-04-24 NOTE — Telephone Encounter (Signed)
Letter mailed

## 2017-06-11 DIAGNOSIS — Z1211 Encounter for screening for malignant neoplasm of colon: Secondary | ICD-10-CM | POA: Diagnosis not present

## 2017-08-30 DIAGNOSIS — Z6822 Body mass index (BMI) 22.0-22.9, adult: Secondary | ICD-10-CM | POA: Diagnosis not present

## 2017-08-30 DIAGNOSIS — H1032 Unspecified acute conjunctivitis, left eye: Secondary | ICD-10-CM | POA: Diagnosis not present

## 2017-08-30 DIAGNOSIS — Z1389 Encounter for screening for other disorder: Secondary | ICD-10-CM | POA: Diagnosis not present

## 2017-10-22 DIAGNOSIS — Z23 Encounter for immunization: Secondary | ICD-10-CM | POA: Diagnosis not present

## 2017-11-13 DIAGNOSIS — Z0001 Encounter for general adult medical examination with abnormal findings: Secondary | ICD-10-CM | POA: Diagnosis not present

## 2017-11-13 DIAGNOSIS — Z6822 Body mass index (BMI) 22.0-22.9, adult: Secondary | ICD-10-CM | POA: Diagnosis not present

## 2017-11-13 DIAGNOSIS — Z1389 Encounter for screening for other disorder: Secondary | ICD-10-CM | POA: Diagnosis not present

## 2017-11-13 DIAGNOSIS — Z0000 Encounter for general adult medical examination without abnormal findings: Secondary | ICD-10-CM | POA: Diagnosis not present

## 2017-11-13 DIAGNOSIS — E782 Mixed hyperlipidemia: Secondary | ICD-10-CM | POA: Diagnosis not present

## 2018-01-25 DIAGNOSIS — Z6823 Body mass index (BMI) 23.0-23.9, adult: Secondary | ICD-10-CM | POA: Diagnosis not present

## 2018-01-25 DIAGNOSIS — Z1389 Encounter for screening for other disorder: Secondary | ICD-10-CM | POA: Diagnosis not present

## 2018-01-25 DIAGNOSIS — J069 Acute upper respiratory infection, unspecified: Secondary | ICD-10-CM | POA: Diagnosis not present

## 2018-01-30 ENCOUNTER — Other Ambulatory Visit (HOSPITAL_COMMUNITY): Payer: Self-pay | Admitting: Internal Medicine

## 2018-01-30 DIAGNOSIS — J22 Unspecified acute lower respiratory infection: Secondary | ICD-10-CM | POA: Diagnosis not present

## 2018-01-30 DIAGNOSIS — Z1231 Encounter for screening mammogram for malignant neoplasm of breast: Secondary | ICD-10-CM

## 2018-01-30 DIAGNOSIS — Z6823 Body mass index (BMI) 23.0-23.9, adult: Secondary | ICD-10-CM | POA: Diagnosis not present

## 2018-02-04 ENCOUNTER — Ambulatory Visit (HOSPITAL_COMMUNITY)
Admission: RE | Admit: 2018-02-04 | Discharge: 2018-02-04 | Disposition: A | Discharge status: Home / Self Care | Payer: Medicare Other | Source: Ambulatory Visit | Attending: Internal Medicine | Admitting: Internal Medicine

## 2018-02-04 ENCOUNTER — Encounter (HOSPITAL_COMMUNITY): Payer: Self-pay

## 2018-02-04 DIAGNOSIS — Z1231 Encounter for screening mammogram for malignant neoplasm of breast: Secondary | ICD-10-CM | POA: Insufficient documentation

## 2018-02-06 ENCOUNTER — Other Ambulatory Visit (HOSPITAL_COMMUNITY): Payer: Self-pay | Admitting: Internal Medicine

## 2018-02-06 DIAGNOSIS — R928 Other abnormal and inconclusive findings on diagnostic imaging of breast: Secondary | ICD-10-CM

## 2018-02-12 ENCOUNTER — Ambulatory Visit (HOSPITAL_COMMUNITY)
Admission: RE | Admit: 2018-02-12 | Discharge: 2018-02-12 | Disposition: A | Discharge status: Home / Self Care | Payer: Medicare Other | Source: Ambulatory Visit | Attending: Internal Medicine | Admitting: Internal Medicine

## 2018-02-12 DIAGNOSIS — N6321 Unspecified lump in the left breast, upper outer quadrant: Secondary | ICD-10-CM | POA: Insufficient documentation

## 2018-02-12 DIAGNOSIS — R928 Other abnormal and inconclusive findings on diagnostic imaging of breast: Secondary | ICD-10-CM | POA: Diagnosis not present

## 2018-02-19 ENCOUNTER — Ambulatory Visit (HOSPITAL_COMMUNITY)
Admission: RE | Admit: 2018-02-19 | Discharge: 2018-02-19 | Disposition: A | Discharge status: Home / Self Care | Payer: Medicare Other | Source: Ambulatory Visit | Attending: Internal Medicine | Admitting: Internal Medicine

## 2018-02-19 DIAGNOSIS — R928 Other abnormal and inconclusive findings on diagnostic imaging of breast: Secondary | ICD-10-CM | POA: Diagnosis not present

## 2018-02-19 DIAGNOSIS — N6002 Solitary cyst of left breast: Secondary | ICD-10-CM | POA: Diagnosis not present

## 2018-08-19 DIAGNOSIS — Z1211 Encounter for screening for malignant neoplasm of colon: Secondary | ICD-10-CM | POA: Diagnosis not present

## 2018-09-19 DIAGNOSIS — Z6822 Body mass index (BMI) 22.0-22.9, adult: Secondary | ICD-10-CM | POA: Diagnosis not present

## 2018-09-19 DIAGNOSIS — I10 Essential (primary) hypertension: Secondary | ICD-10-CM | POA: Diagnosis not present

## 2018-09-19 DIAGNOSIS — Z23 Encounter for immunization: Secondary | ICD-10-CM | POA: Diagnosis not present

## 2018-09-19 DIAGNOSIS — Z1389 Encounter for screening for other disorder: Secondary | ICD-10-CM | POA: Diagnosis not present

## 2018-09-19 DIAGNOSIS — Z0001 Encounter for general adult medical examination with abnormal findings: Secondary | ICD-10-CM | POA: Diagnosis not present

## 2018-09-19 DIAGNOSIS — Z0000 Encounter for general adult medical examination without abnormal findings: Secondary | ICD-10-CM | POA: Diagnosis not present

## 2018-11-29 ENCOUNTER — Other Ambulatory Visit: Payer: Self-pay | Admitting: Orthopedic Surgery

## 2019-05-29 DIAGNOSIS — Z1389 Encounter for screening for other disorder: Secondary | ICD-10-CM | POA: Diagnosis not present

## 2019-05-29 DIAGNOSIS — Z23 Encounter for immunization: Secondary | ICD-10-CM | POA: Diagnosis not present

## 2019-05-29 DIAGNOSIS — Z0001 Encounter for general adult medical examination with abnormal findings: Secondary | ICD-10-CM | POA: Diagnosis not present

## 2019-05-29 DIAGNOSIS — Z6823 Body mass index (BMI) 23.0-23.9, adult: Secondary | ICD-10-CM | POA: Diagnosis not present

## 2019-05-29 DIAGNOSIS — E7849 Other hyperlipidemia: Secondary | ICD-10-CM | POA: Diagnosis not present

## 2019-05-29 DIAGNOSIS — Z0000 Encounter for general adult medical examination without abnormal findings: Secondary | ICD-10-CM | POA: Diagnosis not present

## 2019-05-29 DIAGNOSIS — R400 Somnolence: Secondary | ICD-10-CM | POA: Diagnosis not present

## 2019-05-30 ENCOUNTER — Other Ambulatory Visit (HOSPITAL_COMMUNITY): Payer: Self-pay | Admitting: Physician Assistant

## 2019-05-30 DIAGNOSIS — Z1231 Encounter for screening mammogram for malignant neoplasm of breast: Secondary | ICD-10-CM

## 2019-06-19 ENCOUNTER — Ambulatory Visit (HOSPITAL_COMMUNITY): Payer: Medicare Other

## 2019-08-13 ENCOUNTER — Other Ambulatory Visit (HOSPITAL_COMMUNITY): Payer: Self-pay | Admitting: Physician Assistant

## 2019-08-13 DIAGNOSIS — Z09 Encounter for follow-up examination after completed treatment for conditions other than malignant neoplasm: Secondary | ICD-10-CM

## 2019-08-25 DIAGNOSIS — Z961 Presence of intraocular lens: Secondary | ICD-10-CM | POA: Diagnosis not present

## 2019-08-25 DIAGNOSIS — H26492 Other secondary cataract, left eye: Secondary | ICD-10-CM | POA: Diagnosis not present

## 2019-10-31 DIAGNOSIS — Z23 Encounter for immunization: Secondary | ICD-10-CM | POA: Diagnosis not present

## 2020-02-22 DIAGNOSIS — I10 Essential (primary) hypertension: Secondary | ICD-10-CM | POA: Diagnosis not present

## 2020-02-22 DIAGNOSIS — E7849 Other hyperlipidemia: Secondary | ICD-10-CM | POA: Diagnosis not present

## 2020-04-23 DIAGNOSIS — I10 Essential (primary) hypertension: Secondary | ICD-10-CM | POA: Diagnosis not present

## 2020-04-23 DIAGNOSIS — E7849 Other hyperlipidemia: Secondary | ICD-10-CM | POA: Diagnosis not present

## 2020-04-30 ENCOUNTER — Ambulatory Visit (INDEPENDENT_AMBULATORY_CARE_PROVIDER_SITE_OTHER): Payer: Medicare Other | Admitting: Orthopedic Surgery

## 2020-04-30 ENCOUNTER — Encounter: Payer: Self-pay | Admitting: Orthopedic Surgery

## 2020-04-30 ENCOUNTER — Other Ambulatory Visit: Payer: Self-pay

## 2020-04-30 VITALS — BP 143/81 | HR 79 | Ht 65.0 in | Wt 140.0 lb

## 2020-04-30 DIAGNOSIS — Z98818 Other dental procedure status: Secondary | ICD-10-CM

## 2020-04-30 DIAGNOSIS — M65331 Trigger finger, right middle finger: Secondary | ICD-10-CM

## 2020-04-30 MED ORDER — CLINDAMYCIN HCL 300 MG PO CAPS: ORAL_CAPSULE | ORAL | 5 refills | Status: DC

## 2020-04-30 NOTE — Patient Instructions (Addendum)
You have received an injection of steroids into the joint. 15% of patients will have increased pain within the 24 hours postinjection.   This is transient and will go away.   We recommend that you use ice packs on the injection site for 20 minutes every 2 hours and extra strength Tylenol 2 tablets every 8 as needed until the pain resolves.  If you continue to have pain after taking the Tylenol and using the ice please call the office for further instructions.  Trigger Finger  Trigger finger, also called stenosing tenosynovitis,  is a condition that causes a finger to get stuck in a bent position. Each finger has a tendon, which is a tough, cord-like tissue that connects muscle to bone, and each tendon passes through a tunnel of tissue called a tendon sheath. To move your finger, your tendon needs to glide freely through the sheath. Trigger finger happens when the tendon or the sheath thickens, making it difficult to move your finger. Trigger finger can affect any finger or a thumb. It may affect more than one finger. Mild cases may clear up with rest and medicine. Severe cases require more treatment. What are the causes? Trigger finger is caused by a thickened finger tendon or tendon sheath. The cause of this thickening is not known. What increases the risk? The following factors may make you more likely to develop this condition:  Doing activities that require a strong grip.  Having rheumatoid arthritis, gout, or diabetes.  Being 40-60 years old.  Being female. What are the signs or symptoms? Symptoms of this condition include:  Pain when bending or straightening your finger.  Tenderness or swelling where your finger attaches to the palm of your hand.  A lump in the palm of your hand or on the inside of your finger.  Hearing a noise like a pop or a snap when you try to straighten your finger.  Feeling a catching or locking sensation when you try to straighten your finger.  Being  unable to straighten your finger. How is this diagnosed? This condition is diagnosed based on your symptoms and a physical exam. How is this treated? This condition may be treated by:  Resting your finger and avoiding activities that make symptoms worse.  Wearing a finger splint to keep your finger extended.  Taking NSAIDs, such as ibuprofen, to relieve pain and swelling.  Doing gentle exercises to stretch the finger as told by your health care provider.  Having medicine that reduces swelling and inflammation (steroids) injected into the tendon sheath. Injections may need to be repeated.  Having surgery to open the tendon sheath. This may be done if other treatments do not work and you cannot straighten your finger. You may need physical therapy after surgery. Follow these instructions at home: If you have a splint:  Wear the splint as told by your health care provider. Remove it only as told by your health care provider.  Loosen it if your fingers tingle, become numb, or turn cold and blue.  Keep it clean.  If the splint is not waterproof: ? Do not let it get wet. ? Cover it with a watertight covering when you take a bath or shower. Managing pain, stiffness, and swelling     If directed, apply heat to the affected area as often as told by your health care provider. Use the heat source that your health care provider recommends, such as a moist heat pack or a heating pad.  Place   a towel between your skin and the heat source.  Leave the heat on for 20-30 minutes.  Remove the heat if your skin turns bright red. This is especially important if you are unable to feel pain, heat, or cold. You may have a greater risk of getting burned. If directed, put ice on the painful area. To do this:  If you have a removable splint, remove it as told by your health care provider.  Put ice in a plastic bag.  Place a towel between your skin and the bag or between your splint and the  bag.  Leave the ice on for 20 minutes, 2-3 times a day.  Activity  Rest your finger as told by your health care provider. Avoid activities that make the pain worse.  Return to your normal activities as told by your health care provider. Ask your health care provider what activities are safe for you.  Do exercises as told by your health care provider.  Ask your health care provider when it is safe to drive if you have a splint on your hand. General instructions  Take over-the-counter and prescription medicines only as told by your health care provider.  Keep all follow-up visits as told by your health care provider. This is important. Contact a health care provider if:  Your symptoms are not improving with home care. Summary  Trigger finger, also called stenosing tenosynovitis, causes your finger to get stuck in a bent position. This can make it difficult and painful to straighten your finger.  This condition develops when a finger tendon or tendon sheath thickens.  Treatment may include resting your finger, wearing a splint, and taking medicines.  In severe cases, surgery to open the tendon sheath may be needed. This information is not intended to replace advice given to you by your health care provider. Make sure you discuss any questions you have with your health care provider. Document Revised: 04/28/2019 Document Reviewed: 04/28/2019 Elsevier Patient Education  2020 Elsevier Inc.  

## 2020-04-30 NOTE — Progress Notes (Signed)
NEW PROBLEM//OFFICE VISIT  Chief Complaint  Patient presents with  . Hand Problem    right middle finger ?trigger finger     74 year old female presents with a right long finger/middle finger triggering that she has had since Christmas. And was treated with a splint. The splinting does allow the finger to return to full extension but over the course of the day the finger starts to bend and she has had a couple triggering episodes.     Review of Systems  Endo/Heme/Allergies: Bruises/bleeds easily.     Past Medical History:  Diagnosis Date  . Arthritis   . Hypertension     Past Surgical History:  Procedure Laterality Date  . ABDOMINAL HYSTERECTOMY    . BREAST EXCISIONAL BIOPSY Right   . CATARACT EXTRACTION W/PHACO Right 02/05/2017   Procedure: CATARACT EXTRACTION PHACO AND INTRAOCULAR LENS PLACEMENT RIGHT EYE;  Surgeon: Tonny Branch, MD;  Location: [facility];  Service: Ophthalmology;  Laterality: Right;  CDE:  6.01  . CATARACT EXTRACTION W/PHACO Left 01/15/2017   Procedure: CATARACT EXTRACTION PHACO AND INTRAOCULAR LENS PLACEMENT (IOC);  Surgeon: Tonny Branch, MD;  Location: [facility];  Service: Ophthalmology;  Laterality: Left;  cde-7.04  . JOINT REPLACEMENT     bilateral knees adn right hip  . REPLACEMENT TOTAL KNEE BILATERAL    . TOTAL HIP ARTHROPLASTY Right     History reviewed. No pertinent family history. Social History   Tobacco Use  . Smoking status: Never Smoker  . Smokeless tobacco: Never Used  Substance Use Topics  . Alcohol use: No  . Drug use: No    Allergies  Allergen Reactions  . Codeine Rash  . Penicillins Rash    Has patient had a PCN reaction causing immediate rash, facial/tongue/throat swelling, SOB or lightheadedness with hypotension:unknown Has patient had a PCN reaction causing severe rash involving mucus membranes or skin necrosis:No Has patient had a PCN reaction that required hospitalization No Has patient had a PCN reaction occurring within the last  10 years: No If all of the above answers are "NO", then may proceed with Cephalosporin use.     Current Meds  Medication Sig  . acetaminophen (TYLENOL) 500 MG tablet Take 500 mg by mouth every 6 (six) hours as needed (for pain/headache.).  Marland Kitchen aspirin 81 MG tablet Take 81 mg by mouth daily.  . Calcium Carbonate-Vit D-Min (CALTRATE 600+D PLUS MINERALS) 600-800 MG-UNIT CHEW Chew by mouth.  . DHA-EPA-Flaxseed Oil-Vitamin E (THERATEARS NUTRITION) CAPS Take 3 capsules by mouth daily.  . hydrochlorothiazide (MICROZIDE) 12.5 MG capsule Take 12.5 mg by mouth every evening.   . Pediatric Multivitamins-Iron (FLINTSTONES PLUS IRON PO) Take 1 tablet by mouth daily.  . Potassium 99 MG TABS Take 99 mg by mouth daily.  Marland Kitchen PRESCRIPTION MEDICATION Place 1 drop into the left eye 3 (three) times daily. Prednisolone acetate and Nepafenac 1.0% - 0.1% opthalmic suspension  . pyridOXINE (VITAMIN B-6) 100 MG tablet Take 400 mg by mouth daily.  . TURMERIC PO Take 538 mg by mouth daily.  . [DISCONTINUED] Carboxymethylcellulose Sodium (THERATEARS OP) Place 1-2 drops into the right ear 3 (three) times daily as needed (dry eyes).   . [DISCONTINUED] clindamycin (CLEOCIN) 300 MG capsule TAKE TWO CAPSULES BY MOUTH THIRTY MINUTES PRIOR TO DENTAL PROCEDURE.  . clindamycin (CLEOCIN) 300 MG capsule TAKE TWO CAPSULES BY MOUTH THIRTY MINUTES PRIOR TO DENTAL PROCEDURE.    BP (!) 143/81   Pulse 79   Ht 5\' 5"  (1.651 m)   Wt 140  lb (63.5 kg)   BMI 23.30 kg/m   Physical Exam Constitutional:      General: She is not in acute distress.    Appearance: She is well-developed.  Cardiovascular:     Comments: No peripheral edema Skin:    General: Skin is warm and dry.  Neurological:     Mental Status: She is alert and oriented to person, place, and time.     Sensory: No sensory deficit.     Coordination: Coordination normal.     Gait: Gait normal.     Deep Tendon Reflexes: Reflexes are normal and symmetric.     Ortho  Exam Right hand shows that the right long finger is in slight flexion at the PIP joint both flexor tendons are intact she has swelling and tenderness in the palm over the A1 pulley neurovascular exam color is normal   MEDICAL DECISION MAKING  A.  Encounter Diagnoses  Name Primary?  . Trigger finger, right middle finger   . Other dental procedure status Yes    C. MANAGEMENT  Meds ordered this encounter  Medications  . clindamycin (CLEOCIN) 300 MG capsule    Sig: TAKE TWO CAPSULES BY MOUTH THIRTY MINUTES PRIOR TO DENTAL PROCEDURE.    Dispense:  2 capsule    Refill:  5   Trigger finger injection  Diagnosis  Right middle finger  Procedure injection A1 pulley Medications lidocaine 1% 1 mL and Depo-Medrol 40 mg 1 mL Skin prep alcohol and ethyl chloride Verbal consent was obtained Timeout confirmed the injection site  After cleaning the skin with alcohol and anesthetizing the skin with ethyl chloride the A1 pulley was palpated and the injection was performed without complication  Patient will call us in 2 weeks if there is no improvement for a second injection  We briefly discussed possibility of having surgery if the 2 injections do not control the triggering Fuller Canada, MD  04/30/2020 8:34 AM

## 2020-06-03 DIAGNOSIS — Z23 Encounter for immunization: Secondary | ICD-10-CM | POA: Diagnosis not present

## 2020-06-03 DIAGNOSIS — Z0000 Encounter for general adult medical examination without abnormal findings: Secondary | ICD-10-CM | POA: Diagnosis not present

## 2020-06-03 DIAGNOSIS — E782 Mixed hyperlipidemia: Secondary | ICD-10-CM | POA: Diagnosis not present

## 2020-06-03 DIAGNOSIS — Z1389 Encounter for screening for other disorder: Secondary | ICD-10-CM | POA: Diagnosis not present

## 2020-06-03 DIAGNOSIS — Z6823 Body mass index (BMI) 23.0-23.9, adult: Secondary | ICD-10-CM | POA: Diagnosis not present

## 2020-06-03 DIAGNOSIS — Z0001 Encounter for general adult medical examination with abnormal findings: Secondary | ICD-10-CM | POA: Diagnosis not present

## 2020-06-29 ENCOUNTER — Ambulatory Visit (HOSPITAL_COMMUNITY)
Admission: RE | Admit: 2020-06-29 | Discharge: 2020-06-29 | Disposition: A | Discharge status: Home / Self Care | Payer: Medicare Other | Source: Ambulatory Visit | Attending: Physician Assistant | Admitting: Physician Assistant

## 2020-06-29 ENCOUNTER — Other Ambulatory Visit: Payer: Self-pay

## 2020-06-29 DIAGNOSIS — Z09 Encounter for follow-up examination after completed treatment for conditions other than malignant neoplasm: Secondary | ICD-10-CM | POA: Insufficient documentation

## 2020-06-29 DIAGNOSIS — R928 Other abnormal and inconclusive findings on diagnostic imaging of breast: Secondary | ICD-10-CM | POA: Diagnosis not present

## 2020-06-29 DIAGNOSIS — N6001 Solitary cyst of right breast: Secondary | ICD-10-CM | POA: Diagnosis not present

## 2020-07-09 ENCOUNTER — Other Ambulatory Visit (HOSPITAL_COMMUNITY): Payer: Self-pay | Admitting: Physician Assistant

## 2020-07-15 ENCOUNTER — Other Ambulatory Visit: Payer: Self-pay | Admitting: Physician Assistant

## 2020-07-15 DIAGNOSIS — N6489 Other specified disorders of breast: Secondary | ICD-10-CM

## 2020-08-20 DIAGNOSIS — M9902 Segmental and somatic dysfunction of thoracic region: Secondary | ICD-10-CM | POA: Diagnosis not present

## 2020-08-20 DIAGNOSIS — M25552 Pain in left hip: Secondary | ICD-10-CM | POA: Diagnosis not present

## 2020-08-20 DIAGNOSIS — M9903 Segmental and somatic dysfunction of lumbar region: Secondary | ICD-10-CM | POA: Diagnosis not present

## 2020-08-20 DIAGNOSIS — M9905 Segmental and somatic dysfunction of pelvic region: Secondary | ICD-10-CM | POA: Diagnosis not present

## 2020-08-23 DIAGNOSIS — M9905 Segmental and somatic dysfunction of pelvic region: Secondary | ICD-10-CM | POA: Diagnosis not present

## 2020-08-23 DIAGNOSIS — M9903 Segmental and somatic dysfunction of lumbar region: Secondary | ICD-10-CM | POA: Diagnosis not present

## 2020-08-23 DIAGNOSIS — M25552 Pain in left hip: Secondary | ICD-10-CM | POA: Diagnosis not present

## 2020-08-23 DIAGNOSIS — M9902 Segmental and somatic dysfunction of thoracic region: Secondary | ICD-10-CM | POA: Diagnosis not present

## 2020-08-27 DIAGNOSIS — M9903 Segmental and somatic dysfunction of lumbar region: Secondary | ICD-10-CM | POA: Diagnosis not present

## 2020-08-27 DIAGNOSIS — M9905 Segmental and somatic dysfunction of pelvic region: Secondary | ICD-10-CM | POA: Diagnosis not present

## 2020-08-27 DIAGNOSIS — M25552 Pain in left hip: Secondary | ICD-10-CM | POA: Diagnosis not present

## 2020-08-27 DIAGNOSIS — M9902 Segmental and somatic dysfunction of thoracic region: Secondary | ICD-10-CM | POA: Diagnosis not present

## 2020-08-31 DIAGNOSIS — M9903 Segmental and somatic dysfunction of lumbar region: Secondary | ICD-10-CM | POA: Diagnosis not present

## 2020-08-31 DIAGNOSIS — M9905 Segmental and somatic dysfunction of pelvic region: Secondary | ICD-10-CM | POA: Diagnosis not present

## 2020-08-31 DIAGNOSIS — M25552 Pain in left hip: Secondary | ICD-10-CM | POA: Diagnosis not present

## 2020-08-31 DIAGNOSIS — H43813 Vitreous degeneration, bilateral: Secondary | ICD-10-CM | POA: Diagnosis not present

## 2020-08-31 DIAGNOSIS — M9902 Segmental and somatic dysfunction of thoracic region: Secondary | ICD-10-CM | POA: Diagnosis not present

## 2020-09-01 DIAGNOSIS — M25552 Pain in left hip: Secondary | ICD-10-CM | POA: Diagnosis not present

## 2020-09-01 DIAGNOSIS — M9903 Segmental and somatic dysfunction of lumbar region: Secondary | ICD-10-CM | POA: Diagnosis not present

## 2020-09-01 DIAGNOSIS — M9902 Segmental and somatic dysfunction of thoracic region: Secondary | ICD-10-CM | POA: Diagnosis not present

## 2020-09-01 DIAGNOSIS — M9905 Segmental and somatic dysfunction of pelvic region: Secondary | ICD-10-CM | POA: Diagnosis not present

## 2020-09-07 ENCOUNTER — Ambulatory Visit
Admission: RE | Admit: 2020-09-07 | Discharge: 2020-09-07 | Disposition: A | Discharge status: Home / Self Care | Payer: Medicare Other | Source: Ambulatory Visit | Attending: Physician Assistant | Admitting: Physician Assistant

## 2020-09-07 ENCOUNTER — Other Ambulatory Visit: Payer: Self-pay

## 2020-09-07 DIAGNOSIS — N6489 Other specified disorders of breast: Secondary | ICD-10-CM

## 2020-09-07 DIAGNOSIS — M9905 Segmental and somatic dysfunction of pelvic region: Secondary | ICD-10-CM | POA: Diagnosis not present

## 2020-09-07 DIAGNOSIS — M25552 Pain in left hip: Secondary | ICD-10-CM | POA: Diagnosis not present

## 2020-09-07 DIAGNOSIS — M9902 Segmental and somatic dysfunction of thoracic region: Secondary | ICD-10-CM | POA: Diagnosis not present

## 2020-09-07 DIAGNOSIS — R928 Other abnormal and inconclusive findings on diagnostic imaging of breast: Secondary | ICD-10-CM | POA: Diagnosis not present

## 2020-09-07 DIAGNOSIS — M9903 Segmental and somatic dysfunction of lumbar region: Secondary | ICD-10-CM | POA: Diagnosis not present

## 2020-09-07 DIAGNOSIS — N651 Disproportion of reconstructed breast: Secondary | ICD-10-CM | POA: Diagnosis not present

## 2020-09-15 DIAGNOSIS — M9905 Segmental and somatic dysfunction of pelvic region: Secondary | ICD-10-CM | POA: Diagnosis not present

## 2020-09-15 DIAGNOSIS — M9902 Segmental and somatic dysfunction of thoracic region: Secondary | ICD-10-CM | POA: Diagnosis not present

## 2020-09-15 DIAGNOSIS — M25552 Pain in left hip: Secondary | ICD-10-CM | POA: Diagnosis not present

## 2020-09-15 DIAGNOSIS — M9903 Segmental and somatic dysfunction of lumbar region: Secondary | ICD-10-CM | POA: Diagnosis not present

## 2020-09-22 DIAGNOSIS — M9902 Segmental and somatic dysfunction of thoracic region: Secondary | ICD-10-CM | POA: Diagnosis not present

## 2020-09-22 DIAGNOSIS — M9903 Segmental and somatic dysfunction of lumbar region: Secondary | ICD-10-CM | POA: Diagnosis not present

## 2020-09-22 DIAGNOSIS — M25552 Pain in left hip: Secondary | ICD-10-CM | POA: Diagnosis not present

## 2020-09-22 DIAGNOSIS — M9905 Segmental and somatic dysfunction of pelvic region: Secondary | ICD-10-CM | POA: Diagnosis not present

## 2020-09-23 DIAGNOSIS — I10 Essential (primary) hypertension: Secondary | ICD-10-CM | POA: Diagnosis not present

## 2020-09-23 DIAGNOSIS — E7849 Other hyperlipidemia: Secondary | ICD-10-CM | POA: Diagnosis not present

## 2020-09-29 DIAGNOSIS — M9905 Segmental and somatic dysfunction of pelvic region: Secondary | ICD-10-CM | POA: Diagnosis not present

## 2020-09-29 DIAGNOSIS — M9902 Segmental and somatic dysfunction of thoracic region: Secondary | ICD-10-CM | POA: Diagnosis not present

## 2020-09-29 DIAGNOSIS — M25552 Pain in left hip: Secondary | ICD-10-CM | POA: Diagnosis not present

## 2020-09-29 DIAGNOSIS — M9903 Segmental and somatic dysfunction of lumbar region: Secondary | ICD-10-CM | POA: Diagnosis not present

## 2020-10-07 ENCOUNTER — Other Ambulatory Visit: Payer: Self-pay

## 2020-10-07 ENCOUNTER — Ambulatory Visit (INDEPENDENT_AMBULATORY_CARE_PROVIDER_SITE_OTHER): Payer: Medicare Other | Admitting: Orthopedic Surgery

## 2020-10-07 ENCOUNTER — Ambulatory Visit: Payer: Medicare Other

## 2020-10-07 ENCOUNTER — Telehealth: Payer: Self-pay | Admitting: Orthopedic Surgery

## 2020-10-07 VITALS — BP 130/72 | HR 70 | Ht 65.0 in | Wt 138.0 lb

## 2020-10-07 DIAGNOSIS — M25552 Pain in left hip: Secondary | ICD-10-CM

## 2020-10-07 DIAGNOSIS — Z96641 Presence of right artificial hip joint: Secondary | ICD-10-CM

## 2020-10-07 DIAGNOSIS — M1612 Unilateral primary osteoarthritis, left hip: Secondary | ICD-10-CM

## 2020-10-07 MED ORDER — MELOXICAM 7.5 MG PO TABS: ORAL_TABLET | ORAL | 5 refills | Status: DC

## 2020-10-07 NOTE — Telephone Encounter (Signed)
Voice message received from patient asking for a parking placard to be filled out for her. Do we still have forms?

## 2020-10-07 NOTE — Progress Notes (Signed)
NEW PROBLEM//OFFICE VISIT  Chief Complaint  Patient presents with  . Leg Pain    Left groin pain.     74yo s/p rt tka lt tka rt tha presents with groin pain since July 2021 no trauma;  She was treated by the  Chiropractor without relief. She says she can bring the leg out but no inward and has trouble flexing the hip and putting on her socks    Review of Systems  Musculoskeletal: Positive for back pain.  All other systems reviewed and are negative.    Past Medical History:  Diagnosis Date  . Arthritis   . Hypertension     Past Surgical History:  Procedure Laterality Date  . ABDOMINAL HYSTERECTOMY    . BREAST EXCISIONAL BIOPSY Right   . CATARACT EXTRACTION W/PHACO Right 02/05/2017   Procedure: CATARACT EXTRACTION PHACO AND INTRAOCULAR LENS PLACEMENT RIGHT EYE;  Surgeon: Gemma Payor, MD;  Location: [facility];  Service: Ophthalmology;  Laterality: Right;  CDE:  6.01  . CATARACT EXTRACTION W/PHACO Left 01/15/2017   Procedure: CATARACT EXTRACTION PHACO AND INTRAOCULAR LENS PLACEMENT (IOC);  Surgeon: Gemma Payor, MD;  Location: [facility];  Service: Ophthalmology;  Laterality: Left;  cde-7.04  . JOINT REPLACEMENT     bilateral knees adn right hip  . REPLACEMENT TOTAL KNEE BILATERAL    . TOTAL HIP ARTHROPLASTY Right     No family history on file. Social History   Tobacco Use  . Smoking status: Never Smoker  . Smokeless tobacco: Never Used  Substance Use Topics  . Alcohol use: No  . Drug use: No    Allergies  Allergen Reactions  . Codeine Rash  . Penicillins Rash    Has patient had a PCN reaction causing immediate rash, facial/tongue/throat swelling, SOB or lightheadedness with hypotension:unknown Has patient had a PCN reaction causing severe rash involving mucus membranes or skin necrosis:No Has patient had a PCN reaction that required hospitalization No Has patient had a PCN reaction occurring within the last 10 years: No If all of the above answers are "NO", then may proceed  with Cephalosporin use.     Current Meds  Medication Sig  . acetaminophen (TYLENOL) 500 MG tablet Take 500 mg by mouth every 6 (six) hours as needed (for pain/headache.).  Marland Kitchen aspirin 81 MG tablet Take 81 mg by mouth daily.  . Calcium Carbonate-Vit D-Min (CALTRATE 600+D PLUS MINERALS) 600-800 MG-UNIT CHEW Chew by mouth.  . clindamycin (CLEOCIN) 300 MG capsule TAKE TWO CAPSULES BY MOUTH THIRTY MINUTES PRIOR TO DENTAL PROCEDURE.  . DHA-EPA-Flaxseed Oil-Vitamin E (THERATEARS NUTRITION) CAPS Take 3 capsules by mouth daily.  . hydrochlorothiazide (MICROZIDE) 12.5 MG capsule Take 12.5 mg by mouth every evening.   . Pediatric Multivitamins-Iron (FLINTSTONES PLUS IRON PO) Take 1 tablet by mouth daily.  . Potassium 99 MG TABS Take 99 mg by mouth daily.  Marland Kitchen PRESCRIPTION MEDICATION Place 1 drop into the left eye 3 (three) times daily. Prednisolone acetate and Nepafenac 1.0% - 0.1% opthalmic suspension  . pyridOXINE (VITAMIN B-6) 100 MG tablet Take 400 mg by mouth daily.  . TURMERIC PO Take 538 mg by mouth daily.    BP 130/72   Pulse 70   Ht 5\' 5"  (1.651 m)   Wt 138 lb (62.6 kg)   BMI 22.96 kg/m   Physical Exam Constitutional:      General: She is not in acute distress.    Appearance: She is well-developed.  Cardiovascular:     Comments: No peripheral edema  Skin:    General: Skin is warm and dry.  Neurological:     Mental Status: She is alert and oriented to person, place, and time.     Sensory: No sensory deficit.     Coordination: Coordination normal.     Gait: Gait normal.     Deep Tendon Reflexes: Reflexes are normal and symmetric.     Ortho Exam  Left hip tenderness front of the hip  Decreased flexion, pain with flexion. Decreased IR  Normal abduction Weak flexion  Rt hip s/p tha  Flexion 120 No pain or tenderness   Leg lengths are equal    MEDICAL DECISION MAKING  A.  Encounter Diagnosis  Name Primary?  . Pain in left hip Yes    B. DATA  ANALYSED:   IMAGING: Interpretation of images: severe OA left hip , stable rt tha  Orders: none   Outside records reviewed: Dr Ladona Ridgel, chiropractor tx for back pain and leg pain   C. MANAGEMENT   Pandemic is restricting inpatient admits, non operative treatment will be started   CANE RT HAND   ON THE LIST FOR LEFT THA   I OFFERED HER APPT IN [place] SHE IS ADAMANT THAT I DO IT   Meds ordered this encounter  Medications  . meloxicam (MOBIC) 7.5 MG tablet    Sig: 1 po bid    Dispense:  60 tablet    Refill:  5      Fuller Canada, MD  10/07/2020 9:17 AM

## 2020-10-07 NOTE — Patient Instructions (Signed)
Cane right hand   Start meloxicam twice a day

## 2020-10-07 NOTE — Telephone Encounter (Signed)
Yes, Dr Romeo Apple will sign this afternoon She can come by and pick up I will call her when signed

## 2020-10-23 DIAGNOSIS — E7849 Other hyperlipidemia: Secondary | ICD-10-CM | POA: Diagnosis not present

## 2020-10-23 DIAGNOSIS — I10 Essential (primary) hypertension: Secondary | ICD-10-CM | POA: Diagnosis not present

## 2020-10-25 ENCOUNTER — Telehealth: Payer: Self-pay | Admitting: Orthopedic Surgery

## 2020-10-25 NOTE — Telephone Encounter (Signed)
Patient called to relay that she is taking care of her hip as best she can until surgery can be scheduled, although said she is now having pain down the leg and into her foot, and that her foot has some numbness and tingling. Said thought Dr Romeo Apple would want to know.  Please advise.

## 2020-10-27 ENCOUNTER — Other Ambulatory Visit: Payer: Self-pay | Admitting: Orthopedic Surgery

## 2020-10-27 MED ORDER — PREDNISONE 10 MG (48) PO TBPK: ORAL_TABLET | Freq: Every day | ORAL | 0 refills | Status: DC

## 2020-10-27 MED ORDER — GABAPENTIN 100 MG PO CAPS: 100.0000 mg | ORAL_CAPSULE | Freq: Three times a day (TID) | ORAL | 2 refills | Status: DC

## 2020-10-27 NOTE — Telephone Encounter (Signed)
I called to advise. Gave caution about sleepiness with the Gabapentin  She voiced understanding

## 2020-10-27 NOTE — Progress Notes (Signed)
Meds ordered this encounter  Medications  . predniSONE (STERAPRED UNI-PAK 48 TAB) 10 MG (48) TBPK tablet    Sig: Take by mouth daily.    Dispense:  48 tablet    Refill:  0  . gabapentin (NEURONTIN) 100 MG capsule    Sig: Take 1 capsule (100 mg total) by mouth 3 (three) times daily.    Dispense:  90 capsule    Refill:  2

## 2020-10-27 NOTE — Telephone Encounter (Signed)
I sent some meds in for her radicular pain

## 2020-11-03 DIAGNOSIS — Z23 Encounter for immunization: Secondary | ICD-10-CM | POA: Diagnosis not present

## 2020-11-22 ENCOUNTER — Telehealth: Payer: Self-pay | Admitting: Orthopedic Surgery

## 2020-11-22 DIAGNOSIS — M1612 Unilateral primary osteoarthritis, left hip: Secondary | ICD-10-CM

## 2020-11-22 NOTE — Telephone Encounter (Signed)
We can not do totals ,knee or hip at [facility], if she wants [place], we can refer there  will you call and let them know?? I am covering xray and clinic today

## 2020-11-22 NOTE — Telephone Encounter (Signed)
Probably cannot ask her if she'll see Dr. Magnus Ivan

## 2020-11-22 NOTE — Telephone Encounter (Signed)
I did call Ms. Hodkinson back.  She says she doesn't understand why [facility] can do total joint replacements and [facility] cant.  She wanted to know if we knew when this was going to change and if it was going to be anytime soon.  She said she really wanted her knee done before Christmas.  I reminded her that she was told that Dr. Romeo Apple could refer her to another orthopedist in [place] in order to get it done sooner.  She wanted to know who would he refer her to.  I told her that I was unsure of exactly who we would refer her to and if she could get her knee done before Christmas.  I did tell her that if she wants a referral to go to [facility], I can let Dr Mort Sawyers assistant know and she could take care of that for her.  She said she really didn't want to go to [place].  She wants Dr. Romeo Apple to do her surgery.  Could you call her and let her know who we would refer her to so she still has options?  Thanks

## 2020-11-22 NOTE — Telephone Encounter (Signed)
She is anxious to get scheduled  Can you do her total hip before Christmas? She may be interested in [place] office if you can not

## 2020-11-22 NOTE — Telephone Encounter (Addendum)
Ms.. Prada called back.  She is requesting that we refer her to Dr. Renaye Rakers at [facility].  She wants [place] to send the referral ASAP due to the fact that she wants to get this done before Christmas.   Thanks

## 2020-11-23 DIAGNOSIS — Z6822 Body mass index (BMI) 22.0-22.9, adult: Secondary | ICD-10-CM | POA: Diagnosis not present

## 2020-11-23 DIAGNOSIS — E7849 Other hyperlipidemia: Secondary | ICD-10-CM | POA: Diagnosis not present

## 2020-11-23 DIAGNOSIS — Z1329 Encounter for screening for other suspected endocrine disorder: Secondary | ICD-10-CM | POA: Diagnosis not present

## 2020-11-23 DIAGNOSIS — I10 Essential (primary) hypertension: Secondary | ICD-10-CM | POA: Diagnosis not present

## 2020-11-23 DIAGNOSIS — E079 Disorder of thyroid, unspecified: Secondary | ICD-10-CM | POA: Diagnosis not present

## 2020-11-23 NOTE — Telephone Encounter (Signed)
She has asked for Dr Thurston Hole, she had a friend who had hip surgery by Dr Thurston Hole and has done very well I have sent referral.   FYI only

## 2020-11-29 DIAGNOSIS — M25552 Pain in left hip: Secondary | ICD-10-CM | POA: Diagnosis not present

## 2020-12-07 DIAGNOSIS — R7309 Other abnormal glucose: Secondary | ICD-10-CM | POA: Diagnosis not present

## 2020-12-07 DIAGNOSIS — I10 Essential (primary) hypertension: Secondary | ICD-10-CM | POA: Diagnosis not present

## 2020-12-07 DIAGNOSIS — Z6822 Body mass index (BMI) 22.0-22.9, adult: Secondary | ICD-10-CM | POA: Diagnosis not present

## 2020-12-07 DIAGNOSIS — Z01810 Encounter for preprocedural cardiovascular examination: Secondary | ICD-10-CM | POA: Diagnosis not present

## 2020-12-07 DIAGNOSIS — E7849 Other hyperlipidemia: Secondary | ICD-10-CM | POA: Diagnosis not present

## 2020-12-24 DIAGNOSIS — E7849 Other hyperlipidemia: Secondary | ICD-10-CM | POA: Diagnosis not present

## 2020-12-24 DIAGNOSIS — I10 Essential (primary) hypertension: Secondary | ICD-10-CM | POA: Diagnosis not present

## 2020-12-29 DIAGNOSIS — M25552 Pain in left hip: Secondary | ICD-10-CM | POA: Diagnosis not present

## 2020-12-29 NOTE — H&P (Signed)
HIP ARTHROPLASTY ADMISSION H&P  Patient ID: Brenda [name] MRN: [id number] DOB/AGE: [birth year] 75 y.o.  Chief Complaint: left hip pain.  Planned Procedure Date: 01/11/2021 Medical Clearance by Dwyane Luo PA-C    HPI: Brenda [name] is a 75 y.o. female who presents for evaluation of OA LEFT HIP. The patient has a history of pain and functional disability in the left hip due to arthritis and has failed non-surgical conservative treatments for greater than 12 weeks to include NSAID's and/or analgesics and activity modification.  Onset of symptoms was abrupt, starting 1 years ago with rapidlly worsening course since that time. The patient noted no past surgery on the left hip.  Patient currently rates pain at 6 out of 10 with activity. Patient has night pain, worsening of pain with activity and weight bearing, pain that interferes with activities of daily living, pain with passive range of motion and joint swelling.  Patient has evidence of joint space narrowing by imaging studies.  There is no active infection.  Past Medical History:  Diagnosis Date  . Arthritis   . Hypertension    Past Surgical History:  Procedure Laterality Date  . ABDOMINAL HYSTERECTOMY    . BREAST EXCISIONAL BIOPSY Right   . CATARACT EXTRACTION W/PHACO Right 02/05/2017   Procedure: CATARACT EXTRACTION PHACO AND INTRAOCULAR LENS PLACEMENT RIGHT EYE;  Surgeon: Gemma Payor, MD;  Location: [facility];  Service: Ophthalmology;  Laterality: Right;  CDE:  6.01  . CATARACT EXTRACTION W/PHACO Left 01/15/2017   Procedure: CATARACT EXTRACTION PHACO AND INTRAOCULAR LENS PLACEMENT (IOC);  Surgeon: Gemma Payor, MD;  Location: [facility];  Service: Ophthalmology;  Laterality: Left;  cde-7.04  . JOINT REPLACEMENT     bilateral knees adn right hip  . REPLACEMENT TOTAL KNEE BILATERAL    . TOTAL HIP ARTHROPLASTY Right    Allergies  Allergen Reactions  . Codeine Rash  . Penicillins Rash    Has patient had a PCN reaction causing immediate rash,  facial/tongue/throat swelling, SOB or lightheadedness with hypotension:unknown Has patient had a PCN reaction causing severe rash involving mucus membranes or skin necrosis:No Has patient had a PCN reaction that required hospitalization No Has patient had a PCN reaction occurring within the last 10 years: No If all of the above answers are "NO", then may proceed with Cephalosporin use.    Prior to Admission medications   Medication Sig Start Date End Date Taking? Authorizing Provider  acetaminophen (TYLENOL) 500 MG tablet Take 1,000 mg by mouth every 6 (six) hours.   Yes [provider]  APPLE CIDER VINEGAR PO Take 1 tablet by mouth in the morning and at bedtime.   Yes [provider]  Ascorbic Acid (VITAMIN C) 500 MG CHEW Chew 1,000 mg by mouth daily.   Yes [provider]  aspirin 81 MG tablet Take 81 mg by mouth daily.   Yes [provider]  Calcium Carbonate (CALTRATE 600 PO) Take 600 mg by mouth daily at 12 noon. Chew   Yes [provider]  Carboxymethylcellulose Sodium (THERATEARS) 0.25 % SOLN Place 1 drop into both eyes in the morning, at noon, and at bedtime.   Yes [provider]  cholecalciferol (VITAMIN D3) 25 MCG (1000 UNIT) tablet Take 1,000 Units by mouth daily.   Yes [provider]  clindamycin (CLEOCIN) 300 MG capsule TAKE TWO CAPSULES BY MOUTH THIRTY MINUTES PRIOR TO DENTAL PROCEDURE. Patient taking differently: Take 600 mg by mouth See admin instructions. TAKE TWO CAPSULES BY MOUTH THIRTY  MINUTES PRIOR TO DENTAL PROCEDURE. 04/30/20  Yes Vickki Hearing, MD  DHA-EPA-Flaxseed Oil-Vitamin E [facility] NUTRITION) CAPS Take 3 capsules by mouth daily.   Yes [provider]  hydrochlorothiazide (HYDRODIURIL) 25 MG tablet Take 25 mg by mouth every evening.   Yes [provider]  meloxicam (MOBIC) 7.5 MG tablet 1 po bid Patient taking differently: Take 7.5 mg by mouth in the morning and at bedtime. 1 po  bid 10/07/20  Yes Vickki Hearing, MD  Pediatric Multivitamins-Iron (FLINTSTONES PLUS IRON PO) Take 1 tablet by mouth daily.   Yes [provider]  pyridOXINE (VITAMIN B-6) 100 MG tablet Take 200 mg by mouth daily.   Yes [provider]  traMADol (ULTRAM) 50 MG tablet Take 50 mg by mouth 2 (two) times daily as needed for moderate pain.   Yes [provider]  TURMERIC PO Take 1 capsule by mouth in the morning and at bedtime.   Yes [provider]  vitamin B-12 (CYANOCOBALAMIN) 1000 MCG tablet Take 1,000 mcg by mouth daily.   Yes [provider]   Social History   Socioeconomic History  . Marital status: Divorced    Spouse name: Not on file  . Number of children: Not on file  . Years of education: Not on file  . Highest education level: Not on file  Occupational History  . Not on file  Tobacco Use  . Smoking status: Never Smoker  . Smokeless tobacco: Never Used  Substance and Sexual Activity  . Alcohol use: No  . Drug use: No  . Sexual activity: Not Currently    Birth control/protection: Surgical  Other Topics Concern  . Not on file  Social History Narrative  . Not on file   Social Determinants of Health   Financial Resource Strain: Not on file  Food Insecurity: Not on file  Transportation Needs: Not on file  Physical Activity: Not on file  Stress: Not on file  Social Connections: Not on file   No family history on file.  ROS: Currently denies lightheadedness, dizziness, Fever, chills, CP, SOB.   No personal history of DVT, PE, MI, or CVA. No loose teeth or dentures All other systems have been reviewed and were otherwise currently negative with the exception of those mentioned in the HPI and as above.  Objective: Vitals: Ht: 65 Wt: 140 lbs Temp: 98.0 BP: 170/83 Pulse: 76 O2 99% on room air.   Physical Exam: General: Alert, NAD. Trendelenberg Gait  HEENT: EOMI, Trachea Midline, Head is AT/[place] Pulm: No increased work of  breathing.  Clear B/L A/P w/o crackle or wheeze.  CV: RRR, No m/g/r appreciated  GI: soft, NT, ND Neuro: Neuro without gross focal deficit.  Sensation intact distally Skin: No lesions in the area of chief complaint MSK/Surgical Site: Left Hip pain with passive ROM.  Positive Stinchfield.  5/5 strength.  NVI.    Imaging Review Plain radiographs demonstrate severe degenerative joint disease of the left hip.   The bone quality appears to be good for age and reported activity level.  Preoperative templating of the joint replacement has been completed, documented, and submitted to the Operating Room personnel in order to optimize intra-operative equipment management.  Assessment: OA LEFT HIP Active Problems:   * No active hospital problems. *   Plan: Plan for Procedure(s): TOTAL HIP ARTHROPLASTY ANTERIOR APPROACH  The patient history, physical exam, clinical judgement of the provider and imaging are consistent with end stage degenerative joint disease and  total joint arthroplasty is deemed medically necessary. The treatment options including medical management, injection therapy, and arthroplasty were discussed at length. The risks and benefits of Procedure(s): TOTAL HIP ARTHROPLASTY ANTERIOR APPROACH were presented and reviewed.  The risks of nonoperative treatment, versus surgical intervention including but not limited to continued pain, aseptic loosening, stiffness, dislocation/subluxation, infection, bleeding, nerve injury, blood clots, cardiopulmonary complications, morbidity, mortality, among others were discussed. The patient verbalizes understanding and wishes to proceed with the plan.  Patient is being admitted for surgery, pain control, PT, prophylactic antibiotics, VTE prophylaxis, progressive ambulation, ADL's and discharge planning.   Dental prophylaxis discussed and recommended for 2 years postoperatively.   The patient does meet the criteria for TXA which will be used  perioperatively.    ASA 81 mg BID will be used postoperatively for DVT prophylaxis in addition to SCDs, and early ambulation.  Plan for Oxycodone, Tylenol, Celebrex for pain.  Baclofen for spasm.  Zofran for nausea/vomiting. Omeprazole for gastric protection.  The patient is planning to be discharged home with HHPT ([facility]) in care of her sister/brother.  Pt. Would like to go home same day but may need to stay under observation status due to the time of surgery.  Follow-up appointment 01/26/2021 @4pm      Rachael Fee, PA-C 12/29/2020 12:45 PM

## 2021-01-03 NOTE — Progress Notes (Signed)
DUE TO COVID-19 ONLY ONE VISITOR IS ALLOWED TO COME WITH YOU AND STAY IN THE WAITING ROOM ONLY DURING PRE OP AND PROCEDURE DAY OF SURGERY. THE 1 VISITOR  MAY VISIT WITH YOU AFTER SURGERY IN YOUR PRIVATE ROOM DURING VISITING HOURS ONLY!  YOU NEED TO HAVE A COVID 19 TEST ON__1/14/2022_____ @_______ , THIS TEST MUST BE DONE BEFORE SURGERY,  COVID TESTING SITE [address] [place] , IT IS ON THE RIGHT GOING OUT [address] APPROXIMATELY  2 MINUTES PAST [employer] ON THE RIGHT. ONCE YOUR COVID TEST IS COMPLETED,  PLEASE BEGIN THE QUARANTINE INSTRUCTIONS AS OUTLINED IN YOUR HANDOUT.                GENEA RHEAUME  01/03/2021   Your procedure is scheduled on: 01/11/2021    Report to [facility] Main  Entrance   Report to admitting at   0750am  AM     Call this number if you have problems the morning of surgery [phone]    REMEMBER: NO  SOLID FOOD CANDY OR GUM AFTER MIDNIGHT. CLEAR LIQUIDS UNTIL   0720am         . NOTHING BY MOUTH EXCEPT CLEAR LIQUIDS UNTIL    . PLEASE FINISH ENSURE DRINK PER SURGEON ORDER  WHICH NEEDS TO BE COMPLETED AT      .  0720am     CLEAR LIQUID DIET   Foods Allowed                                                                    Coffee and tea, regular and decaf                            Fruit ices (not with fruit pulp)                                      Iced Popsicles                                    Carbonated beverages, regular and diet                                    Cranberry, grape and apple juices Sports drinks like Gatorade Lightly seasoned clear broth or consume(fat free) Sugar, honey syrup ___________________________________________________________________      BRUSH YOUR TEETH MORNING OF SURGERY AND RINSE YOUR MOUTH OUT, NO CHEWING GUM CANDY OR MINTS.     Take these medicines the morning of surgery with A SIP OF WATER: none   DO NOT TAKE ANY DIABETIC MEDICATIONS DAY OF YOUR SURGERY                                You may not have any metal on your body including hair pins and              piercings  Do  not wear jewelry, make-up, lotions, powders or perfumes, deodorant             Do not wear nail polish on your fingernails.  Do not shave  48 hours prior to surgery.              Men may shave face and neck.   Do not bring valuables to the hospital. [place].  Contacts, dentures or bridgework may not be worn into surgery.  Leave suitcase in the car. After surgery it may be brought to your room.     Patients discharged the day of surgery will not be allowed to drive home. IF YOU ARE HAVING SURGERY AND GOING HOME THE SAME DAY, YOU MUST HAVE AN ADULT TO DRIVE YOU HOME AND BE WITH YOU FOR 24 HOURS. YOU MAY GO HOME BY TAXI OR UBER OR ORTHERWISE, BUT AN ADULT MUST ACCOMPANY YOU HOME AND STAY WITH YOU FOR 24 HOURS.  Name and phone number of your driver:  Special Instructions: N/A              Please read over the following fact sheets you were given: _____________________________________________________________________  [facility] - Preparing for Surgery Before surgery, you can play an important role.  Because skin is not sterile, your skin needs to be as free of germs as possible.  You can reduce the number of germs on your skin by washing with CHG (chlorahexidine gluconate) soap before surgery.  CHG is an antiseptic cleaner which kills germs and bonds with the skin to continue killing germs even after washing. Please DO NOT use if you have an allergy to CHG or antibacterial soaps.  If your skin becomes reddened/irritated stop using the CHG and inform your nurse when you arrive at Short Stay. Do not shave (including legs and underarms) for at least 48 hours prior to the first CHG shower.  You may shave your face/neck. Please follow these instructions carefully:  1.  Shower with CHG Soap the night before surgery and the  morning of  Surgery.  2.  If you choose to wash your hair, wash your hair first as usual with your  normal  shampoo.  3.  After you shampoo, rinse your hair and body thoroughly to remove the  shampoo.                           4.  Use CHG as you would any other liquid soap.  You can apply chg directly  to the skin and wash                       Gently with a scrungie or clean washcloth.  5.  Apply the CHG Soap to your body ONLY FROM THE NECK DOWN.   Do not use on face/ open                           Wound or open sores. Avoid contact with eyes, ears mouth and genitals (private parts).                       Wash face,  Genitals (private parts) with your normal soap.             6.  Wash thoroughly,  paying special attention to the area where your surgery  will be performed.  7.  Thoroughly rinse your body with warm water from the neck down.  8.  DO NOT shower/wash with your normal soap after using and rinsing off  the CHG Soap.                9.  Pat yourself dry with a clean towel.            10.  Wear clean pajamas.            11.  Place clean sheets on your bed the night of your first shower and do not  sleep with pets. Day of Surgery : Do not apply any lotions/deodorants the morning of surgery.  Please wear clean clothes to the hospital/surgery center.  FAILURE TO FOLLOW THESE INSTRUCTIONS MAY RESULT IN THE CANCELLATION OF YOUR SURGERY PATIENT SIGNATURE_________________________________  NURSE SIGNATURE__________________________________  ________________________________________________________________________

## 2021-01-03 NOTE — Care Plan (Signed)
Ortho Bundle Case Management Note  Patient Details  Name: Brenda [name] MRN: [id number] Date of Birth: December 11, 1946  Spoke with patient prior to surgery. She will discharge to home with family. Has equipment at home. HHPT referral to [facility] and OPPT set up with [facility]. Patient and MD in agreement with plan. Choice offered.                     DME Arranged:    DME Agency:     HH Arranged:  PT HH Agency:  [facility]  Additional Comments: Please contact me with any questions of if this plan should need to change.  Shauna Hugh,  RN,BSN,MHA,CCM  [facility] Orthopaedic Specialist  [phone] 01/03/2021, 11:14 AM

## 2021-01-04 ENCOUNTER — Encounter (HOSPITAL_COMMUNITY): Payer: Self-pay

## 2021-01-04 ENCOUNTER — Encounter (HOSPITAL_COMMUNITY)
Admission: RE | Admit: 2021-01-04 | Discharge: 2021-01-04 | Disposition: A | Discharge status: Home / Self Care | Payer: Medicare Other | Source: Ambulatory Visit | Attending: Orthopedic Surgery | Admitting: Orthopedic Surgery

## 2021-01-04 ENCOUNTER — Other Ambulatory Visit: Payer: Self-pay

## 2021-01-04 DIAGNOSIS — Z01818 Encounter for other preprocedural examination: Secondary | ICD-10-CM | POA: Insufficient documentation

## 2021-01-04 HISTORY — DX: Nausea with vomiting, unspecified: Z98.890

## 2021-01-04 HISTORY — DX: Nausea with vomiting, unspecified: R11.2

## 2021-01-04 HISTORY — DX: Family history of other specified conditions: Z84.89

## 2021-01-04 HISTORY — DX: Cardiac murmur, unspecified: R01.1

## 2021-01-04 LAB — BASIC METABOLIC PANEL
Anion gap: 9 (ref 5–15)
BUN: 29 mg/dL — ABNORMAL HIGH (ref 8–23)
CO2: 31 mmol/L (ref 22–32)
Calcium: 9.2 mg/dL (ref 8.9–10.3)
Chloride: 88 mmol/L — ABNORMAL LOW (ref 98–111)
Creatinine, Ser: 0.56 mg/dL (ref 0.44–1.00)
GFR, Estimated: >60 mL/min (ref >60)
Glucose, Bld: 99 mg/dL (ref 70–99)
Potassium: 3.8 mmol/L (ref 3.5–5.1)
Sodium: 128 mmol/L — ABNORMAL LOW (ref 135–145)

## 2021-01-04 LAB — CBC
HCT: 38.5 % (ref 36.0–46.0)
Hemoglobin: 12.7 g/dL (ref 12.0–15.0)
MCH: 26.8 pg (ref 26.0–34.0)
MCHC: 33 g/dL (ref 30.0–36.0)
MCV: 81.2 fL (ref 80.0–100.0)
Platelets: 279 10*3/uL (ref 150–400)
RBC: 4.74 MIL/uL (ref 3.87–5.11)
RDW: 17 % — ABNORMAL HIGH (ref 11.5–15.5)
WBC: 5.7 10*3/uL (ref 4.0–10.5)
nRBC: 0 % (ref 0.0–0.2)

## 2021-01-04 LAB — SURGICAL PCR SCREEN
MRSA, PCR: NEGATIVE
Staphylococcus aureus: POSITIVE — AB

## 2021-01-04 NOTE — Progress Notes (Addendum)
Anesthesia Review:  PCP:   [facility] DR Elfredia Nevins  Requested LOV note on 01/04/2021.  [phone]  Cardiologist : Chest x-ray : EKG :01/04/2021 Echo : Stress test: Cardiac Cath :  Activity level:  Can do a flight of stairs without difficulty  Sleep Study/ CPAP : no  Fasting Blood Sugar :      / Checks Blood Sugar -- times a day:   Blood Thinner/ Instructions /Last Dose: ASA / Instructions/ Last Dose :  pcr and bmp done 01/04/2021 routed to Dr Eulah Pont.  pcr pos for staph routed to DR Eulah Pont

## 2021-01-07 ENCOUNTER — Other Ambulatory Visit (HOSPITAL_COMMUNITY): Payer: Medicare Other

## 2021-01-07 ENCOUNTER — Other Ambulatory Visit (HOSPITAL_COMMUNITY)
Admission: RE | Admit: 2021-01-07 | Discharge: 2021-01-07 | Disposition: A | Discharge status: Home / Self Care | Payer: Medicare Other | Source: Ambulatory Visit | Attending: Orthopedic Surgery | Admitting: Orthopedic Surgery

## 2021-01-07 DIAGNOSIS — Z20822 Contact with and (suspected) exposure to covid-19: Secondary | ICD-10-CM | POA: Diagnosis not present

## 2021-01-07 DIAGNOSIS — Z01812 Encounter for preprocedural laboratory examination: Secondary | ICD-10-CM | POA: Diagnosis not present

## 2021-01-07 LAB — SARS CORONAVIRUS 2 (TAT 6-24 HRS): SARS Coronavirus 2: NEGATIVE

## 2021-01-10 ENCOUNTER — Encounter (HOSPITAL_COMMUNITY): Payer: Self-pay | Admitting: Orthopedic Surgery

## 2021-01-10 NOTE — Anesthesia Preprocedure Evaluation (Signed)
Anesthesia Evaluation  Patient identified by MRN, date of birth, ID band Patient awake    Reviewed: Allergy & Precautions, NPO status , Patient's Chart, lab work & pertinent test results  History of Anesthesia Complications (+) PONV and history of anesthetic complications  Airway Mallampati: I  TM Distance: >3 FB Neck ROM: Full    Dental  (+) Teeth Intact, Dental Advisory Given   Pulmonary neg pulmonary ROS,    breath sounds clear to auscultation       Cardiovascular hypertension, Pt. on medications + Valvular Problems/Murmurs  Rhythm:Regular Rate:Normal     Neuro/Psych negative neurological ROS  negative psych ROS   GI/Hepatic negative GI ROS, Neg liver ROS,   Endo/Other  negative endocrine ROS  Renal/GU negative Renal ROS     Musculoskeletal  (+) Arthritis ,   Abdominal Normal abdominal exam  (+)   Peds  Hematology negative hematology ROS (+)   Anesthesia Other Findings   Reproductive/Obstetrics                            Anesthesia Physical Anesthesia Plan  ASA: II  Anesthesia Plan: Spinal   Post-op Pain Management:    Induction: Intravenous  PONV Risk Score and Plan: 4 or greater and Propofol infusion, Ondansetron and Treatment may vary due to age or medical condition  Airway Management Planned: Natural Airway and Simple Face Mask  Additional Equipment: None  Intra-op Plan:   Post-operative Plan:   Informed Consent: I have reviewed the patients History and Physical, chart, labs and discussed the procedure including the risks, benefits and alternatives for the proposed anesthesia with the patient or authorized representative who has indicated his/her understanding and acceptance.       Plan Discussed with: CRNA  Anesthesia Plan Comments: (Lab Results      Component                Value               Date                      WBC                      5.7                  01/04/2021                HGB                      12.7                01/04/2021                HCT                      38.5                01/04/2021                MCV                      81.2                01/04/2021                PLT  279                 01/04/2021           )       Anesthesia Quick Evaluation

## 2021-01-11 ENCOUNTER — Ambulatory Visit (HOSPITAL_COMMUNITY): Payer: Medicare Other

## 2021-01-11 ENCOUNTER — Ambulatory Visit (HOSPITAL_COMMUNITY): Payer: Medicare Other | Admitting: Physician Assistant

## 2021-01-11 ENCOUNTER — Encounter (HOSPITAL_COMMUNITY): Payer: Self-pay | Admitting: Orthopedic Surgery

## 2021-01-11 ENCOUNTER — Ambulatory Visit (HOSPITAL_COMMUNITY): Payer: Medicare Other | Admitting: Anesthesiology

## 2021-01-11 ENCOUNTER — Ambulatory Visit (HOSPITAL_COMMUNITY)
Admission: RE | Admit: 2021-01-11 | Discharge: 2021-01-11 | Disposition: A | Discharge status: Home / Self Care | Payer: Medicare Other | Source: Ambulatory Visit | Attending: Orthopedic Surgery | Admitting: Orthopedic Surgery

## 2021-01-11 ENCOUNTER — Encounter (HOSPITAL_COMMUNITY)
Admission: RE | Disposition: A | Discharge status: Home / Self Care | Payer: Self-pay | Source: Ambulatory Visit | Attending: Orthopedic Surgery

## 2021-01-11 DIAGNOSIS — Z885 Allergy status to narcotic agent status: Secondary | ICD-10-CM | POA: Insufficient documentation

## 2021-01-11 DIAGNOSIS — Z471 Aftercare following joint replacement surgery: Secondary | ICD-10-CM | POA: Diagnosis not present

## 2021-01-11 DIAGNOSIS — Z791 Long term (current) use of non-steroidal anti-inflammatories (NSAID): Secondary | ICD-10-CM | POA: Diagnosis not present

## 2021-01-11 DIAGNOSIS — Z7982 Long term (current) use of aspirin: Secondary | ICD-10-CM | POA: Insufficient documentation

## 2021-01-11 DIAGNOSIS — M1612 Unilateral primary osteoarthritis, left hip: Secondary | ICD-10-CM | POA: Insufficient documentation

## 2021-01-11 DIAGNOSIS — Z96642 Presence of left artificial hip joint: Secondary | ICD-10-CM | POA: Diagnosis not present

## 2021-01-11 DIAGNOSIS — Z419 Encounter for procedure for purposes other than remedying health state, unspecified: Secondary | ICD-10-CM

## 2021-01-11 DIAGNOSIS — Z79899 Other long term (current) drug therapy: Secondary | ICD-10-CM | POA: Insufficient documentation

## 2021-01-11 DIAGNOSIS — Z880 Allergy status to penicillin: Secondary | ICD-10-CM | POA: Insufficient documentation

## 2021-01-11 DIAGNOSIS — Z96653 Presence of artificial knee joint, bilateral: Secondary | ICD-10-CM | POA: Diagnosis not present

## 2021-01-11 DIAGNOSIS — M1990 Unspecified osteoarthritis, unspecified site: Secondary | ICD-10-CM | POA: Diagnosis not present

## 2021-01-11 DIAGNOSIS — I10 Essential (primary) hypertension: Secondary | ICD-10-CM | POA: Diagnosis not present

## 2021-01-11 HISTORY — PX: TOTAL HIP ARTHROPLASTY: SHX124

## 2021-01-11 LAB — BASIC METABOLIC PANEL
Anion gap: 13 (ref 5–15)
BUN: 23 mg/dL (ref 8–23)
CO2: 27 mmol/L (ref 22–32)
Calcium: 9.8 mg/dL (ref 8.9–10.3)
Chloride: 94 mmol/L — ABNORMAL LOW (ref 98–111)
Creatinine, Ser: 0.62 mg/dL (ref 0.44–1.00)
GFR, Estimated: >60 mL/min (ref >60)
Glucose, Bld: 124 mg/dL — ABNORMAL HIGH (ref 70–99)
Potassium: 3.6 mmol/L (ref 3.5–5.1)
Sodium: 134 mmol/L — ABNORMAL LOW (ref 135–145)

## 2021-01-11 LAB — TYPE AND SCREEN
ABO/RH(D): O NEG
Antibody Screen: NEGATIVE

## 2021-01-11 SURGERY — ARTHROPLASTY, HIP, TOTAL, ANTERIOR APPROACH
Anesthesia: Spinal | Site: Hip | Laterality: Left

## 2021-01-11 MED ORDER — LACTATED RINGERS IV SOLN: INTRAVENOUS | Status: DC

## 2021-01-11 MED ORDER — DIPHENHYDRAMINE HCL 12.5 MG/5ML PO ELIX: 12.5000 mg | ORAL_SOLUTION | ORAL | Status: DC | PRN

## 2021-01-11 MED ORDER — DIPHENHYDRAMINE HCL 50 MG PO TABS: 50.0000 mg | ORAL_TABLET | Freq: Four times a day (QID) | ORAL | 0 refills | Status: DC | PRN

## 2021-01-11 MED ORDER — HYDROMORPHONE HCL 1 MG/ML IJ SOLN: 0.5000 mg | INTRAMUSCULAR | Status: DC | PRN

## 2021-01-11 MED ORDER — TRANEXAMIC ACID-NACL 1000-0.7 MG/100ML-% IV SOLN: 1000.0000 mg | Freq: Once | INTRAVENOUS | Status: DC

## 2021-01-11 MED ORDER — AMISULPRIDE (ANTIEMETIC) 5 MG/2ML IV SOLN: 10.0000 mg | Freq: Once | INTRAVENOUS | Status: DC | PRN

## 2021-01-11 MED ORDER — BUPIVACAINE LIPOSOME 1.3 % IJ SUSP
20.0000 mL | Freq: Once | INTRAMUSCULAR | Status: DC
  Filled 2021-01-11: qty 20

## 2021-01-11 MED ORDER — SODIUM CHLORIDE (PF) 0.9 % IJ SOLN
INTRAMUSCULAR | Status: AC
  Filled 2021-01-11: qty 20

## 2021-01-11 MED ORDER — ACETAMINOPHEN 10 MG/ML IV SOLN
1000.0000 mg | Freq: Once | INTRAVENOUS | Status: DC | PRN
Start: 2021-01-11 — End: 2021-01-11

## 2021-01-11 MED ORDER — SODIUM CHLORIDE FLUSH 0.9 % IV SOLN
INTRAVENOUS | Status: DC | PRN
  Administered 2021-01-11: 20 mL

## 2021-01-11 MED ORDER — LIDOCAINE HCL (PF) 2 % IJ SOLN
INTRAMUSCULAR | Status: AC
  Filled 2021-01-11: qty 10

## 2021-01-11 MED ORDER — MEPERIDINE HCL 50 MG/ML IJ SOLN
6.2500 mg | INTRAMUSCULAR | Status: DC | PRN
Start: 2021-01-11 — End: 2021-01-11

## 2021-01-11 MED ORDER — DEXAMETHASONE SODIUM PHOSPHATE 10 MG/ML IJ SOLN
INTRAMUSCULAR | Status: AC
  Filled 2021-01-11: qty 2

## 2021-01-11 MED ORDER — LACTATED RINGERS IV BOLUS
500.0000 mL | Freq: Once | INTRAVENOUS | Status: AC
  Administered 2021-01-11: 500 mL via INTRAVENOUS

## 2021-01-11 MED ORDER — ACETAMINOPHEN 500 MG PO TABS: 1000.0000 mg | ORAL_TABLET | Freq: Four times a day (QID) | ORAL | Status: DC

## 2021-01-11 MED ORDER — CHLORHEXIDINE GLUCONATE 0.12 % MT SOLN
15.0000 mL | Freq: Once | OROMUCOSAL | Status: AC
  Administered 2021-01-11: 15 mL via OROMUCOSAL

## 2021-01-11 MED ORDER — FENTANYL CITRATE (PF) 100 MCG/2ML IJ SOLN
INTRAMUSCULAR | Status: DC | PRN
  Administered 2021-01-11: 50 ug via INTRAVENOUS

## 2021-01-11 MED ORDER — BUPIVACAINE LIPOSOME 1.3 % IJ SUSP
INTRAMUSCULAR | Status: DC | PRN
  Administered 2021-01-11: 20 mL

## 2021-01-11 MED ORDER — DOCUSATE SODIUM 100 MG PO CAPS: 100.0000 mg | ORAL_CAPSULE | Freq: Two times a day (BID) | ORAL | Status: DC

## 2021-01-11 MED ORDER — ONDANSETRON HCL 4 MG/2ML IJ SOLN
INTRAMUSCULAR | Status: DC | PRN
  Administered 2021-01-11: 4 mg via INTRAVENOUS

## 2021-01-11 MED ORDER — MIDAZOLAM HCL 5 MG/5ML IJ SOLN
INTRAMUSCULAR | Status: DC | PRN
  Administered 2021-01-11: 1 mg via INTRAVENOUS

## 2021-01-11 MED ORDER — HYDROMORPHONE HCL 1 MG/ML IJ SOLN
0.2500 mg | INTRAMUSCULAR | Status: DC | PRN
Start: 2021-01-11 — End: 2021-01-11

## 2021-01-11 MED ORDER — METHOCARBAMOL 750 MG PO TABS: 750.0000 mg | ORAL_TABLET | Freq: Three times a day (TID) | ORAL | 0 refills | Status: AC | PRN

## 2021-01-11 MED ORDER — LACTATED RINGERS IV BOLUS: 250.0000 mL | Freq: Once | INTRAVENOUS | Status: DC

## 2021-01-11 MED ORDER — ONDANSETRON HCL 4 MG/2ML IJ SOLN: 4.0000 mg | Freq: Four times a day (QID) | INTRAMUSCULAR | Status: DC | PRN

## 2021-01-11 MED ORDER — SENNOSIDES-DOCUSATE SODIUM 8.6-50 MG PO TABS: 1.0000 | ORAL_TABLET | Freq: Every evening | ORAL | Status: DC | PRN

## 2021-01-11 MED ORDER — CELECOXIB 200 MG PO CAPS: 200.0000 mg | ORAL_CAPSULE | Freq: Two times a day (BID) | ORAL | 0 refills | Status: AC

## 2021-01-11 MED ORDER — METHOCARBAMOL 500 MG PO TABS: 500.0000 mg | ORAL_TABLET | Freq: Four times a day (QID) | ORAL | Status: DC | PRN

## 2021-01-11 MED ORDER — METOCLOPRAMIDE HCL 5 MG PO TABS
5.0000 mg | ORAL_TABLET | Freq: Three times a day (TID) | ORAL | Status: DC | PRN
  Filled 2021-01-11: qty 2

## 2021-01-11 MED ORDER — ASPIRIN 81 MG PO TABS: 81.0000 mg | ORAL_TABLET | Freq: Two times a day (BID) | ORAL | 0 refills | Status: DC

## 2021-01-11 MED ORDER — OXYCODONE HCL 5 MG PO TABS: 5.0000 mg | ORAL_TABLET | ORAL | Status: DC | PRN

## 2021-01-11 MED ORDER — ONDANSETRON HCL 4 MG/2ML IJ SOLN
INTRAMUSCULAR | Status: AC
  Filled 2021-01-11: qty 4

## 2021-01-11 MED ORDER — OMEPRAZOLE 20 MG PO CPDR: 20.0000 mg | DELAYED_RELEASE_CAPSULE | Freq: Every day | ORAL | 1 refills | Status: DC

## 2021-01-11 MED ORDER — ACETAMINOPHEN 325 MG PO TABS: 325.0000 mg | ORAL_TABLET | Freq: Once | ORAL | Status: DC | PRN

## 2021-01-11 MED ORDER — MIDAZOLAM HCL 2 MG/2ML IJ SOLN
INTRAMUSCULAR | Status: AC
  Filled 2021-01-11: qty 2

## 2021-01-11 MED ORDER — PROPOFOL 500 MG/50ML IV EMUL
INTRAVENOUS | Status: DC | PRN
  Administered 2021-01-11: 75 ug/kg/min via INTRAVENOUS

## 2021-01-11 MED ORDER — PROPOFOL 10 MG/ML IV BOLUS
INTRAVENOUS | Status: AC
  Filled 2021-01-11: qty 40

## 2021-01-11 MED ORDER — ONDANSETRON HCL 4 MG PO TABS: 4.0000 mg | ORAL_TABLET | Freq: Every day | ORAL | 1 refills | Status: AC | PRN

## 2021-01-11 MED ORDER — WATER FOR IRRIGATION, STERILE IR SOLN
Status: DC | PRN
  Administered 2021-01-11: 1000 mL

## 2021-01-11 MED ORDER — PROPOFOL 10 MG/ML IV BOLUS
INTRAVENOUS | Status: DC | PRN
  Administered 2021-01-11 (×2): 20 mg via INTRAVENOUS

## 2021-01-11 MED ORDER — ACETAMINOPHEN 160 MG/5ML PO SOLN: 325.0000 mg | Freq: Once | ORAL | Status: DC | PRN

## 2021-01-11 MED ORDER — TRANEXAMIC ACID-NACL 1000-0.7 MG/100ML-% IV SOLN
1000.0000 mg | INTRAVENOUS | Status: AC
  Administered 2021-01-11: 1000 mg via INTRAVENOUS
  Filled 2021-01-11: qty 100

## 2021-01-11 MED ORDER — FENTANYL CITRATE (PF) 100 MCG/2ML IJ SOLN
INTRAMUSCULAR | Status: AC
  Filled 2021-01-11: qty 2

## 2021-01-11 MED ORDER — LIDOCAINE 2% (20 MG/ML) 5 ML SYRINGE
INTRAMUSCULAR | Status: DC | PRN
  Administered 2021-01-11: 40 mg via INTRAVENOUS

## 2021-01-11 MED ORDER — PHENOL 1.4 % MT LIQD
1.0000 | OROMUCOSAL | Status: DC | PRN
Start: 2021-01-11 — End: 2021-01-11

## 2021-01-11 MED ORDER — OXYCODONE HCL 5 MG PO TABS
5.0000 mg | ORAL_TABLET | Freq: Four times a day (QID) | ORAL | 0 refills | Status: AC | PRN
Start: 2021-01-11 — End: 2021-01-18

## 2021-01-11 MED ORDER — BISACODYL 10 MG RE SUPP: 10.0000 mg | Freq: Every day | RECTAL | Status: DC | PRN

## 2021-01-11 MED ORDER — 0.9 % SODIUM CHLORIDE (POUR BTL) OPTIME
TOPICAL | Status: DC | PRN
  Administered 2021-01-11: 1000 mL

## 2021-01-11 MED ORDER — CELECOXIB 200 MG PO CAPS: 200.0000 mg | ORAL_CAPSULE | Freq: Two times a day (BID) | ORAL | Status: DC

## 2021-01-11 MED ORDER — METHOCARBAMOL 500 MG IVPB - SIMPLE MED: 500.0000 mg | Freq: Four times a day (QID) | INTRAVENOUS | Status: DC | PRN

## 2021-01-11 MED ORDER — PHENYLEPHRINE HCL-NACL 10-0.9 MG/250ML-% IV SOLN
INTRAVENOUS | Status: AC
  Filled 2021-01-11: qty 500

## 2021-01-11 MED ORDER — MENTHOL 3 MG MT LOZG: 1.0000 | LOZENGE | OROMUCOSAL | Status: DC | PRN

## 2021-01-11 MED ORDER — CEFAZOLIN SODIUM-DEXTROSE 2-4 GM/100ML-% IV SOLN: 2.0000 g | Freq: Four times a day (QID) | INTRAVENOUS | Status: DC

## 2021-01-11 MED ORDER — PHENYLEPHRINE HCL-NACL 10-0.9 MG/250ML-% IV SOLN
INTRAVENOUS | Status: DC | PRN
  Administered 2021-01-11: 15 ug/min via INTRAVENOUS

## 2021-01-11 MED ORDER — CEFAZOLIN SODIUM-DEXTROSE 2-4 GM/100ML-% IV SOLN
2.0000 g | INTRAVENOUS | Status: AC
  Administered 2021-01-11: 2 g via INTRAVENOUS
  Filled 2021-01-11: qty 100

## 2021-01-11 MED ORDER — METOCLOPRAMIDE HCL 5 MG/ML IJ SOLN: 5.0000 mg | Freq: Three times a day (TID) | INTRAMUSCULAR | Status: DC | PRN

## 2021-01-11 MED ORDER — DEXAMETHASONE SODIUM PHOSPHATE 10 MG/ML IJ SOLN
INTRAMUSCULAR | Status: DC | PRN
  Administered 2021-01-11: 4 mg via INTRAVENOUS

## 2021-01-11 MED ORDER — DIPHENHYDRAMINE HCL 50 MG/ML IJ SOLN: 50.0000 mg | Freq: Once | INTRAMUSCULAR | Status: DC

## 2021-01-11 MED ORDER — BUPIVACAINE IN DEXTROSE 0.75-8.25 % IT SOLN
INTRATHECAL | Status: DC | PRN
  Administered 2021-01-11: 1.8 mL via INTRATHECAL

## 2021-01-11 MED ORDER — ASPIRIN 81 MG PO CHEW: 81.0000 mg | CHEWABLE_TABLET | Freq: Two times a day (BID) | ORAL | Status: DC

## 2021-01-11 MED ORDER — ORAL CARE MOUTH RINSE: 15.0000 mL | Freq: Once | OROMUCOSAL | Status: AC

## 2021-01-11 MED ORDER — OXYCODONE HCL 5 MG PO TABS: 10.0000 mg | ORAL_TABLET | ORAL | Status: DC | PRN

## 2021-01-11 MED ORDER — ONDANSETRON HCL 4 MG PO TABS
4.0000 mg | ORAL_TABLET | Freq: Four times a day (QID) | ORAL | Status: DC | PRN
  Filled 2021-01-11: qty 1

## 2021-01-11 SURGICAL SUPPLY — 42 items
BAG ZIPLOCK 12X15 (MISCELLANEOUS) IMPLANT
BLADE SAG 18X100X1.27 (BLADE) ×2 IMPLANT
BLADE SURG SZ10 CARB STEEL (BLADE) IMPLANT
CHLORAPREP W/TINT 26 (MISCELLANEOUS) ×2 IMPLANT
CLSR STERI-STRIP ANTIMIC 1/2X4 (GAUZE/BANDAGES/DRESSINGS) ×2 IMPLANT
COVER PERINEAL POST (MISCELLANEOUS) ×2 IMPLANT
COVER SURGICAL LIGHT HANDLE (MISCELLANEOUS) ×2 IMPLANT
COVER WAND RF STERILE (DRAPES) IMPLANT
DECANTER SPIKE VIAL GLASS SM (MISCELLANEOUS) ×4 IMPLANT
DRAPE IMP U-DRAPE 54X76 (DRAPES) ×2 IMPLANT
DRAPE STERI IOBAN 125X83 (DRAPES) ×2 IMPLANT
DRAPE U-SHAPE 47X51 STRL (DRAPES) ×4 IMPLANT
DRSG MEPILEX BORDER 4X8 (GAUZE/BANDAGES/DRESSINGS) ×2 IMPLANT
ELECT REM PT RETURN 15FT ADLT (MISCELLANEOUS) ×2 IMPLANT
GLOVE BIO SURGEON STRL SZ7.5 (GLOVE) ×4 IMPLANT
GLOVE SRG 8 PF TXTR STRL LF DI (GLOVE) ×1 IMPLANT
GLOVE SURG UNDER POLY LF SZ7.5 (GLOVE) ×2 IMPLANT
GLOVE SURG UNDER POLY LF SZ8 (GLOVE) ×2
GOWN STRL REUS W/TWL LRG LVL3 (GOWN DISPOSABLE) ×2 IMPLANT
GOWN STRL REUS W/TWL XL LVL3 (GOWN DISPOSABLE) ×2 IMPLANT
HEAD BIOLOX HIP 36/+2.5 (Joint) ×1 IMPLANT
HIP BIOLOX HD 36/+2.5 (Joint) ×2 IMPLANT
HOLDER FOLEY CATH W/STRAP (MISCELLANEOUS) IMPLANT
INSERT 0 DEGREE 36 (Miscellaneous) ×2 IMPLANT
KIT TURNOVER KIT A (KITS) IMPLANT
MANIFOLD NEPTUNE II (INSTRUMENTS) ×2 IMPLANT
NS IRRIG 1000ML POUR BTL (IV SOLUTION) ×2 IMPLANT
PACK ANTERIOR HIP CUSTOM (KITS) ×2 IMPLANT
PROTECTOR NERVE ULNAR (MISCELLANEOUS) ×2 IMPLANT
SCREW HEX LP 6.5X20 (Screw) ×2 IMPLANT
SHELL ACETAB TRIDENT 48 (Shell) ×2 IMPLANT
STEM HIP 4 127DEG (Stem) ×2 IMPLANT
SUT MNCRL AB 3-0 PS2 18 (SUTURE) ×2 IMPLANT
SUT STRATAFIX 0 PDS 27 VIOLET (SUTURE) ×2
SUT VIC AB 0 CT1 36 (SUTURE) ×2 IMPLANT
SUT VIC AB 1 CT1 36 (SUTURE) ×2 IMPLANT
SUT VIC AB 2-0 CT1 27 (SUTURE) ×4
SUT VIC AB 2-0 CT1 TAPERPNT 27 (SUTURE) ×2 IMPLANT
SUTURE STRATFX 0 PDS 27 VIOLET (SUTURE) ×1 IMPLANT
TRAY FOLEY MTR SLVR 16FR STAT (SET/KITS/TRAYS/PACK) IMPLANT
TUBE SUCTION HIGH CAP CLEAR NV (SUCTIONS) ×2 IMPLANT
WATER STERILE IRR 1000ML POUR (IV SOLUTION) ×4 IMPLANT

## 2021-01-11 NOTE — Op Note (Signed)
01/11/2021  7:28 AM  PATIENT:  Brenda [name]   MRN: [id number]  PRE-OPERATIVE DIAGNOSIS:  OA LEFT HIP  POST-OPERATIVE DIAGNOSIS:  OA LEFT HIP  PROCEDURE:  Procedure(s): TOTAL HIP ARTHROPLASTY ANTERIOR APPROACH  PREOPERATIVE INDICATIONS:    Brenda [name] is an 75 y.o. female who has a diagnosis of <principal problem not specified> and elected for surgical management after failing conservative treatment.  The risks benefits and alternatives were discussed with the patient including but not limited to the risks of nonoperative treatment, versus surgical intervention including infection, bleeding, nerve injury, periprosthetic fracture, the need for revision surgery, dislocation, leg length discrepancy, blood clots, cardiopulmonary complications, morbidity, mortality, among others, and they were willing to proceed.     OPERATIVE REPORT     SURGEON:   Renette Butters, MD    ASSISTANT:  Margy Clarks, PA-C, he was present and scrubbed throughout the case, critical for completion in a timely fashion, and for retraction, instrumentation, and closure.     ANESTHESIA:  General    COMPLICATIONS:  None.     COMPONENTS:  [employer] acolade fit femur size 4 with a 36 mm +2.5 head ball and an acetabular shell size 48 with a  polyethylene liner    PROCEDURE IN DETAIL:   The patient was met in the holding area and  identified.  The appropriate hip was identified and marked at the operative site.  The patient was then transported to the OR  and  placed under anesthesia per that record.  At that point, the patient was  placed in the supine position and  secured to the operating room table and all bony prominences padded. He received pre-operative antibiotics    The operative lower extremity was prepped from the iliac crest to the distal leg.  Sterile draping was performed.  Time out was performed prior to incision.      Skin incision was made just 2 cm lateral to the ASIS  extending in line with  the tensor fascia lata. Electrocautery was used to control all bleeders. I dissected down sharply to the fascia of the tensor fascia lata was confirmed that the muscle fibers beneath were running posteriorly. I then incised the fascia over the superficial tensor fascia lata in line with the incision. The fascia was elevated off the anterior aspect of the muscle the muscle was retracted posteriorly and protected throughout the case. I then used electrocautery to incise the tensor fascia lata fascia control and all bleeders. Immediately visible was the fat over top of the anterior neck and capsule.  I removed the anterior fat from the capsule and elevated the rectus muscle off of the anterior capsule. I then removed a large time of capsule. The retractors were then placed over the anterior acetabulum as well as around the superior and inferior neck.  I then made a femoral neck cut. Then used the power corkscrew to remove the femoral head from the acetabulum and thoroughly irrigated the acetabulum. I sized the femoral head.    I then exposed the deep acetabulum, cleared out any tissue including the ligamentum teres.   After adequate visualization, I excised the labrum, and then sequentially reamed.  I then impacted the acetabular implant into place using fluoroscopy for guidance.  Appropriate version and inclination was confirmed clinically matching their bony anatomy, and with fluoroscopy.  I placed a 20 mm screw in the posterior/superio position with an excellent bite.    I then placed the polyethylene liner in  place  I then adducted the leg and released the external rotators from the posterior femur allowing it to be easily delivered up lateral and anterior to the acetabulum for preparation of the femoral canal.    I then prepared the proximal femur using the cookie-cutter and then sequentially reamed and broached.  A trial broach, neck, and head was utilized, and I reduced the hip and used  floroscopy to assess the neck length and femoral implant.  I then impacted the femoral prosthesis into place into the appropriate version. The hip was then reduced and fluoroscopy confirmed appropriate position. Leg lengths were restored.  I then irrigated the hip copiously again with, and repaired the fascia with Vicryl, followed by monocryl for the subcutaneous tissue, Monocryl for the skin, Steri-Strips and sterile gauze. The patient was then awakened and returned to PACU in stable and satisfactory condition. There were no complications.  POST OPERATIVE PLAN: WBAT, DVT px: SCD's/TED, ambulation and chemical dvt px  [name], MD Orthopedic Surgeon [phone]

## 2021-01-11 NOTE — Transfer of Care (Signed)
Immediate Anesthesia Transfer of Care Note  Patient: Brenda [name]  Procedure(s) Performed: Procedure(s): TOTAL HIP ARTHROPLASTY ANTERIOR APPROACH (Left)  Patient Location: PACU  Anesthesia Type:Spinal  Level of Consciousness:  sedated, patient cooperative and responds to stimulation  Airway & Oxygen Therapy:Patient Spontanous Breathing and Patient connected to face mask oxgen  Post-op Assessment:  Report given to PACU RN and Post -op Vital signs reviewed and stable  Post vital signs:  Reviewed and stable  Last Vitals:  Vitals:   01/11/21 0625  BP: 128/67  Pulse: 72  Resp: 16  Temp: 36.7 C  SpO2: 97%    Complications: No apparent anesthesia complications

## 2021-01-11 NOTE — Anesthesia Procedure Notes (Signed)
Spinal  Patient location during procedure: OR Start time: 01/11/2021 7:36 AM End time: 01/11/2021 7:36 AM Staffing Performed: resident/CRNA  Anesthesiologist: Effie Berkshire, MD Resident/CRNA: Lavina Hamman, CRNA Preanesthetic Checklist Completed: patient identified, IV checked, site marked, risks and benefits discussed, surgical consent, monitors and equipment checked, pre-op evaluation and timeout performed Spinal Block Patient position: sitting Prep: DuraPrep Patient monitoring: heart rate, cardiac monitor, continuous pulse ox and blood pressure Approach: midline Location: L3-4 Injection technique: single-shot Needle Needle type: Sprotte  Needle gauge: 24 G Needle length: 9 cm Needle insertion depth: 7 cm Assessment Sensory level: T6 Additional Notes IV functioning, monitors applied to pt. Expiration date of kit checked and confirmed to be in date. Sterile prep and drape, hand hygiene and sterile gloved used. Pt was positioned and spine was prepped in sterile fashion. Skin was anesthetized with lidocaine. Free flow of clear CSF obtained prior to injecting local anesthetic into CSF x 1 attempt. Spinal needle aspirated freely following injection. Needle was carefully withdrawn, and pt tolerated procedure well. Loss of motor and sensory on exam post injection.

## 2021-01-11 NOTE — Anesthesia Postprocedure Evaluation (Signed)
Anesthesia Post Note  Patient: Brenda [name]  Procedure(s) Performed: TOTAL HIP ARTHROPLASTY ANTERIOR APPROACH (Left Hip)     Patient location during evaluation: PACU Anesthesia Type: Spinal Level of consciousness: oriented and awake and alert Pain management: pain level controlled Vital Signs Assessment: post-procedure vital signs reviewed and stable Respiratory status: spontaneous breathing, respiratory function stable and patient connected to nasal cannula oxygen Cardiovascular status: blood pressure returned to baseline and stable Postop Assessment: no headache, no backache, no apparent nausea or vomiting and spinal receding Anesthetic complications: no   No complications documented.  Last Vitals:  Vitals:   01/11/21 1100 01/11/21 1116  BP: 134/73 136/76  Pulse: 74 82  Resp: 15 16  Temp: 36.7 C 36.7 C  SpO2: 97% 100%    Last Pain:  Vitals:   01/11/21 1116  TempSrc: Oral  PainSc: 0-No pain                 Shelton Silvas

## 2021-01-11 NOTE — Interval H&P Note (Signed)
History and Physical Interval Note:  01/11/2021 7:12 AM  Brenda [name]  has presented today for surgery, with the diagnosis of OA LEFT HIP.  The various methods of treatment have been discussed with the patient and family. After consideration of risks, benefits and other options for treatment, the patient has consented to  Procedure(s): TOTAL HIP ARTHROPLASTY ANTERIOR APPROACH (Left) as a surgical intervention.  The patient's history has been reviewed, patient examined, no change in status, stable for surgery.  I have reviewed the patient's chart and labs.  Questions were answered to the patient's satisfaction.     Sheral Apley

## 2021-01-11 NOTE — Evaluation (Signed)
Physical Therapy Evaluation Patient Details Name: Brenda [name] MRN: [id number] DOB: 23-Jan-1946 Today's Date: 01/11/2021   History of Present Illness  s/p L DA THA. PMH: OA, HTN  Clinical Impression  Pt is s/p THA resulting in the deficits listed below (see PT Problem List).   Pt doing very well. Ready to d/c with family assist from PT standpoint.  Pt will benefit from skilled PT to increase their independence and safety with mobility to allow discharge to the venue listed below.      Follow Up Recommendations Home health PT;Follow surgeon's recommendation for DC plan and follow-up therapies    Equipment Recommendations  None recommended by PT    Recommendations for Other Services       Precautions / Restrictions Precautions Precautions: Fall Restrictions Weight Bearing Restrictions: No Other Position/Activity Restrictions: WBAT      Mobility  Bed Mobility Overal bed mobility: Needs Assistance Bed Mobility: Supine to Sit;Sit to Supine     Supine to sit: Supervision Sit to supine: Min guard   General bed mobility comments: for safety, incr time    Transfers Overall transfer level: Needs assistance Equipment used: Rolling walker (2 wheeled) Transfers: Sit to/from Stand Sit to Stand: Min guard;Supervision         General transfer comment: cues for hand placement, min/guard to supervision for safety  Ambulation/Gait Ambulation/Gait assistance: Supervision;Min guard Gait Distance (Feet): 100 Feet Assistive device: Rolling walker (2 wheeled) Gait Pattern/deviations: Step-to pattern;Step-through pattern;Decreased weight shift to left     General Gait Details: decr L hip and knee flexion, pt able to incr with cues. improving fluidity of gait with incr distance  Stairs Stairs: Yes Stairs assistance: Min guard;Min assist Stair Management: Step to pattern;One rail Left;With cane Number of Stairs: 4 General stair comments: cues for sequence and safae  technique  Wheelchair Mobility    Modified Rankin (Stroke Patients Only)       Balance                                             Pertinent Vitals/Pain Pain Assessment: No/denies pain    Home Living Family/patient expects to be discharged to:: Private residence Living Arrangements: Alone Available Help at Discharge: Family Type of Home: House Home Access: Stairs to enter Entrance Stairs-Rails: Right;Left;Can reach both Entrance Stairs-Number of Steps: 4 Home Layout: One level Atlas - single point;Walker - 2 wheels;Grab bars - toilet;Wheelchair - manual      Prior Function Level of Independence: Independent               Hand Dominance        Extremity/Trunk Assessment   Upper Extremity Assessment Upper Extremity Assessment: Overall WFL for tasks assessed    Lower Extremity Assessment Lower Extremity Assessment: LLE deficits/detail LLE Deficits / Details: grossly 3/5       Communication      Cognition Arousal/Alertness: Awake/alert Behavior During Therapy: WFL for tasks assessed/performed Overall Cognitive Status: Within Functional Limits for tasks assessed                                        General Comments      Exercises Total Joint Exercises Ankle Circles/Pumps: AROM;Both;10 reps Quad Sets: 10 reps;Both;AROM Heel Slides: AROM;AAROM;Left;10 reps Hip  ABduction/ADduction: AROM;10 reps   Assessment/Plan    PT Assessment All further PT needs can be met in the next venue of care  PT Problem List         PT Treatment Interventions      PT Goals (Current goals can be found in the Care Plan section)  Acute Rehab PT Goals Patient Stated Goal: home PT Goal Formulation: All assessment and education complete, DC therapy    Frequency     Barriers to discharge        Co-evaluation               AM-PAC PT "6 Clicks" Mobility  Outcome Measure Help needed turning from your back  to your side while in a flat bed without using bedrails?: A Little Help needed moving from lying on your back to sitting on the side of a flat bed without using bedrails?: A Little Help needed moving to and from a bed to a chair (including a wheelchair)?: A Little Help needed standing up from a chair using your arms (e.g., wheelchair or bedside chair)?: A Little Help needed to walk in hospital room?: A Little Help needed climbing 3-5 steps with a railing? : A Little 6 Click Score: 18    End of Session Equipment Utilized During Treatment: Gait belt Activity Tolerance: Patient tolerated treatment well Patient left: in bed;with call bell/phone within reach Nurse Communication: Mobility status PT Visit Diagnosis: Difficulty in walking, not elsewhere classified (R26.2)    Time: 8685-4883 PT Time Calculation (min) (ACUTE ONLY): 26 min   Charges:   PT Evaluation $PT Eval Low Complexity: 1 Low PT Treatments $Gait Training: 8-22 mins        Baxter Flattery, PT  Acute Rehab Dept ([place]) [phone] Pager [phone]  01/11/2021   [facility] 01/11/2021, 1:25 PM

## 2021-01-11 NOTE — Discharge Instructions (Signed)
You may bear weight as tolerated. Keep your dressing on and dry until follow up. Take medicine to prevent blood clots as directed. Take pain medicine as needed with the goal of transitioning to over the counter medicines.  If needed, you may increase breakthrough pain medication (oxycodone) for the first few days post op - up to 2 tablets every 4 hours.  Stop this medication as soon as you are able.  INSTRUCTIONS AFTER JOINT REPLACEMENT   o Remove items at home which could result in a fall. This includes throw rugs or furniture in walking pathways o ICE to the affected joint every three hours while awake for 30 minutes at a time, for at least the first 3-5 days, and then as needed for pain and swelling.  Continue to use ice for pain and swelling. You may notice swelling that will progress down to the foot and ankle.  This is normal after surgery.  Elevate your leg when you are not up walking on it.   o Continue to use the breathing machine you got in the hospital (incentive spirometer) which will help keep your temperature down.  It is common for your temperature to cycle up and down following surgery, especially at night when you are not up moving around and exerting yourself.  The breathing machine keeps your lungs expanded and your temperature down.   DIET:  As you were doing prior to hospitalization, we recommend a well-balanced diet.  DRESSING / WOUND CARE / SHOWERING  You may shower 3 days after surgery, but keep the wounds dry during showering.  You may use an occlusive plastic wrap (Press'n Seal for example) with blue painter's tape at edges, NO SOAKING/SUBMERGING IN THE BATHTUB.  If the bandage gets wet, change with a clean dry gauze.  If the incision gets wet, pat the wound dry with a clean towel.  ACTIVITY  o Increase activity slowly as tolerated, but follow the weight bearing instructions below.   o No driving for 6 weeks or until further direction given by your physician.  You  cannot drive while taking narcotics.  o No lifting or carrying greater than 10 lbs. until further directed by your surgeon. o Avoid periods of inactivity such as sitting longer than an hour when not asleep. This helps prevent blood clots.  o You may return to work once you are authorized by your doctor.     WEIGHT BEARING   Weight bearing as tolerated with assist device (walker, cane, etc) as directed, use it as long as suggested by your surgeon or therapist, typically at least 4-6 weeks.   EXERCISES  Results after joint replacement surgery are often greatly improved when you follow the exercise, range of motion and muscle strengthening exercises prescribed by your doctor. Safety measures are also important to protect the joint from further injury. Any time any of these exercises cause you to have increased pain or swelling, decrease what you are doing until you are comfortable again and then slowly increase them. If you have problems or questions, call your caregiver or physical therapist for advice.   Rehabilitation is important following a joint replacement. After just a few days of immobilization, the muscles of the leg can become weakened and shrink (atrophy).  These exercises are designed to build up the tone and strength of the thigh and leg muscles and to improve motion. Often times heat used for twenty to thirty minutes before working out will loosen up your tissues and help with   improving the range of motion but do not use heat for the first two weeks following surgery (sometimes heat can increase post-operative swelling).   These exercises can be done on a training (exercise) mat, on the floor, on a table or on a bed. Use whatever works the best and is most comfortable for you.    Use music or television while you are exercising so that the exercises are a pleasant break in your day. This will make your life better with the exercises acting as a break in your routine that you can look  forward to.   Perform all exercises about fifteen times, three times per day or as directed.  You should exercise both the operative leg and the other leg as well.  Exercises include:   . Quad Sets - Tighten up the muscle on the front of the thigh (Quad) and hold for 5-10 seconds.   . Straight Leg Raises - With your knee straight (if you were given a brace, keep it on), lift the leg to 60 degrees, hold for 3 seconds, and slowly lower the leg.  Perform this exercise against resistance later as your leg gets stronger.  . Leg Slides: Lying on your back, slowly slide your foot toward your buttocks, bending your knee up off the floor (only go as far as is comfortable). Then slowly slide your foot back down until your leg is flat on the floor again.  . Angel Wings: Lying on your back spread your legs to the side as far apart as you can without causing discomfort.  . Hamstring Strength:  Lying on your back, push your heel against the floor with your leg straight by tightening up the muscles of your buttocks.  Repeat, but this time bend your knee to a comfortable angle, and push your heel against the floor.  You may put a pillow under the heel to make it more comfortable if necessary.   A rehabilitation program following joint replacement surgery can speed recovery and prevent re-injury in the future due to weakened muscles. Contact your doctor or a physical therapist for more information on knee rehabilitation.    CONSTIPATION  Constipation is defined medically as fewer than three stools per week and severe constipation as less than one stool per week.  Even if you have a regular bowel pattern at home, your normal regimen is likely to be disrupted due to multiple reasons following surgery.  Combination of anesthesia, postoperative narcotics, change in appetite and fluid intake all can affect your bowels.   YOU MUST use at least one of the following options; they are listed in order of increasing strength  to get the job done.  They are all available over the counter, and you may need to use some, POSSIBLY even all of these options:    Drink plenty of fluids (prune juice may be helpful) and high fiber foods Colace 100 mg by mouth twice a day  Senokot for constipation as directed and as needed Dulcolax (bisacodyl), take with full glass of water  Miralax (polyethylene glycol) once or twice a day as needed.  If you have tried all these things and are unable to have a bowel movement in the first 3-4 days after surgery call either your surgeon or your primary doctor.    If you experience loose stools or diarrhea, hold the medications until you stool forms back up.  If your symptoms do not get better within 1 week or if they get worse,   check with your doctor.  If you experience "the worst abdominal pain ever" or develop nausea or vomiting, please contact the office immediately for further recommendations for treatment.   ITCHING:  If you experience itching with your medications, try taking only a single pain pill, or even half a pain pill at a time.  You can also use Benadryl over the counter for itching or also to help with sleep.   TED HOSE STOCKINGS:  Use stockings on both legs until for at least 2 weeks or as directed by physician office. They may be removed at night for sleeping.  MEDICATIONS:  See your medication summary on the "After Visit Summary" that nursing will review with you.  You may have some home medications which will be placed on hold until you complete the course of blood thinner medication.  It is important for you to complete the blood thinner medication as prescribed.  PRECAUTIONS:  If you experience chest pain or shortness of breath - call 911 immediately for transfer to the hospital emergency department.   If you develop a fever greater that 101 F, purulent drainage from wound, increased redness or drainage from wound, foul odor from the wound/dressing, or calf pain - CONTACT  YOUR SURGEON.                                                   FOLLOW-UP APPOINTMENTS:  If you do not already have a post-op appointment, please call the office for an appointment to be seen by your surgeon.  Guidelines for how soon to be seen are listed in your "After Visit Summary", but are typically between 1-4 weeks after surgery.  OTHER INSTRUCTIONS:  Dental Antibiotics:  In most cases prophylactic antibiotics for Dental procdeures after total joint surgery are not necessary.  Exceptions are as follows:  1. History of prior total joint infection  2. Severely immunocompromised (Organ Transplant, cancer chemotherapy, Rheumatoid biologic meds such as Humera)  3. Poorly controlled diabetes (A1C &gt; 8.0, blood glucose over 200)  If you have one of these conditions, contact your surgeon for an antibiotic prescription, prior to your dental procedure.   MAKE SURE YOU:  . Understand these instructions.  . Get help right away if you are not doing well or get worse.    Thank you for letting us be a part of your medical care team.  It is a privilege we respect greatly.  We hope these instructions will help you stay on track for a fast and full recovery!     

## 2021-01-12 ENCOUNTER — Encounter (HOSPITAL_COMMUNITY): Payer: Self-pay | Admitting: Orthopedic Surgery

## 2021-01-12 DIAGNOSIS — Z96653 Presence of artificial knee joint, bilateral: Secondary | ICD-10-CM | POA: Diagnosis not present

## 2021-01-12 DIAGNOSIS — Z7982 Long term (current) use of aspirin: Secondary | ICD-10-CM | POA: Diagnosis not present

## 2021-01-12 DIAGNOSIS — Z471 Aftercare following joint replacement surgery: Secondary | ICD-10-CM | POA: Diagnosis not present

## 2021-01-12 DIAGNOSIS — I10 Essential (primary) hypertension: Secondary | ICD-10-CM | POA: Diagnosis not present

## 2021-01-12 DIAGNOSIS — Z96642 Presence of left artificial hip joint: Secondary | ICD-10-CM | POA: Diagnosis not present

## 2021-01-12 DIAGNOSIS — Z96641 Presence of right artificial hip joint: Secondary | ICD-10-CM | POA: Diagnosis not present

## 2021-01-12 DIAGNOSIS — Z791 Long term (current) use of non-steroidal anti-inflammatories (NSAID): Secondary | ICD-10-CM | POA: Diagnosis not present

## 2021-01-12 DIAGNOSIS — M1990 Unspecified osteoarthritis, unspecified site: Secondary | ICD-10-CM | POA: Diagnosis not present

## 2021-01-12 DIAGNOSIS — Z79891 Long term (current) use of opiate analgesic: Secondary | ICD-10-CM | POA: Diagnosis not present

## 2021-01-14 DIAGNOSIS — Z96642 Presence of left artificial hip joint: Secondary | ICD-10-CM | POA: Diagnosis not present

## 2021-01-14 DIAGNOSIS — Z96653 Presence of artificial knee joint, bilateral: Secondary | ICD-10-CM | POA: Diagnosis not present

## 2021-01-14 DIAGNOSIS — Z7982 Long term (current) use of aspirin: Secondary | ICD-10-CM | POA: Diagnosis not present

## 2021-01-14 DIAGNOSIS — Z471 Aftercare following joint replacement surgery: Secondary | ICD-10-CM | POA: Diagnosis not present

## 2021-01-14 DIAGNOSIS — Z79891 Long term (current) use of opiate analgesic: Secondary | ICD-10-CM | POA: Diagnosis not present

## 2021-01-14 DIAGNOSIS — Z96641 Presence of right artificial hip joint: Secondary | ICD-10-CM | POA: Diagnosis not present

## 2021-01-14 DIAGNOSIS — I10 Essential (primary) hypertension: Secondary | ICD-10-CM | POA: Diagnosis not present

## 2021-01-14 DIAGNOSIS — Z791 Long term (current) use of non-steroidal anti-inflammatories (NSAID): Secondary | ICD-10-CM | POA: Diagnosis not present

## 2021-01-14 DIAGNOSIS — M1990 Unspecified osteoarthritis, unspecified site: Secondary | ICD-10-CM | POA: Diagnosis not present

## 2021-01-17 DIAGNOSIS — I10 Essential (primary) hypertension: Secondary | ICD-10-CM | POA: Diagnosis not present

## 2021-01-17 DIAGNOSIS — M1990 Unspecified osteoarthritis, unspecified site: Secondary | ICD-10-CM | POA: Diagnosis not present

## 2021-01-17 DIAGNOSIS — Z79891 Long term (current) use of opiate analgesic: Secondary | ICD-10-CM | POA: Diagnosis not present

## 2021-01-17 DIAGNOSIS — Z96642 Presence of left artificial hip joint: Secondary | ICD-10-CM | POA: Diagnosis not present

## 2021-01-17 DIAGNOSIS — Z791 Long term (current) use of non-steroidal anti-inflammatories (NSAID): Secondary | ICD-10-CM | POA: Diagnosis not present

## 2021-01-17 DIAGNOSIS — Z96653 Presence of artificial knee joint, bilateral: Secondary | ICD-10-CM | POA: Diagnosis not present

## 2021-01-17 DIAGNOSIS — Z471 Aftercare following joint replacement surgery: Secondary | ICD-10-CM | POA: Diagnosis not present

## 2021-01-17 DIAGNOSIS — Z96641 Presence of right artificial hip joint: Secondary | ICD-10-CM | POA: Diagnosis not present

## 2021-01-17 DIAGNOSIS — Z7982 Long term (current) use of aspirin: Secondary | ICD-10-CM | POA: Diagnosis not present

## 2021-01-19 DIAGNOSIS — Z7982 Long term (current) use of aspirin: Secondary | ICD-10-CM | POA: Diagnosis not present

## 2021-01-19 DIAGNOSIS — Z96641 Presence of right artificial hip joint: Secondary | ICD-10-CM | POA: Diagnosis not present

## 2021-01-19 DIAGNOSIS — Z96642 Presence of left artificial hip joint: Secondary | ICD-10-CM | POA: Diagnosis not present

## 2021-01-19 DIAGNOSIS — Z791 Long term (current) use of non-steroidal anti-inflammatories (NSAID): Secondary | ICD-10-CM | POA: Diagnosis not present

## 2021-01-19 DIAGNOSIS — Z471 Aftercare following joint replacement surgery: Secondary | ICD-10-CM | POA: Diagnosis not present

## 2021-01-19 DIAGNOSIS — Z79891 Long term (current) use of opiate analgesic: Secondary | ICD-10-CM | POA: Diagnosis not present

## 2021-01-19 DIAGNOSIS — M1990 Unspecified osteoarthritis, unspecified site: Secondary | ICD-10-CM | POA: Diagnosis not present

## 2021-01-19 DIAGNOSIS — Z96653 Presence of artificial knee joint, bilateral: Secondary | ICD-10-CM | POA: Diagnosis not present

## 2021-01-19 DIAGNOSIS — I10 Essential (primary) hypertension: Secondary | ICD-10-CM | POA: Diagnosis not present

## 2021-01-24 DIAGNOSIS — M1612 Unilateral primary osteoarthritis, left hip: Secondary | ICD-10-CM | POA: Diagnosis not present

## 2021-01-25 DIAGNOSIS — Z7982 Long term (current) use of aspirin: Secondary | ICD-10-CM | POA: Diagnosis not present

## 2021-01-25 DIAGNOSIS — Z96653 Presence of artificial knee joint, bilateral: Secondary | ICD-10-CM | POA: Diagnosis not present

## 2021-01-25 DIAGNOSIS — Z791 Long term (current) use of non-steroidal anti-inflammatories (NSAID): Secondary | ICD-10-CM | POA: Diagnosis not present

## 2021-01-25 DIAGNOSIS — M1990 Unspecified osteoarthritis, unspecified site: Secondary | ICD-10-CM | POA: Diagnosis not present

## 2021-01-25 DIAGNOSIS — Z79891 Long term (current) use of opiate analgesic: Secondary | ICD-10-CM | POA: Diagnosis not present

## 2021-01-25 DIAGNOSIS — I10 Essential (primary) hypertension: Secondary | ICD-10-CM | POA: Diagnosis not present

## 2021-01-25 DIAGNOSIS — Z96641 Presence of right artificial hip joint: Secondary | ICD-10-CM | POA: Diagnosis not present

## 2021-01-25 DIAGNOSIS — Z471 Aftercare following joint replacement surgery: Secondary | ICD-10-CM | POA: Diagnosis not present

## 2021-01-25 DIAGNOSIS — Z96642 Presence of left artificial hip joint: Secondary | ICD-10-CM | POA: Diagnosis not present

## 2021-01-26 ENCOUNTER — Telehealth (HOSPITAL_COMMUNITY): Payer: Self-pay | Admitting: Physical Therapy

## 2021-01-26 DIAGNOSIS — M1612 Unilateral primary osteoarthritis, left hip: Secondary | ICD-10-CM | POA: Diagnosis not present

## 2021-01-26 NOTE — Telephone Encounter (Signed)
pt called and said that the MD does not feel like she needs therapy

## 2021-01-27 ENCOUNTER — Ambulatory Visit (HOSPITAL_COMMUNITY): Payer: Medicare Other | Admitting: Physical Therapy

## 2021-01-31 ENCOUNTER — Ambulatory Visit (HOSPITAL_COMMUNITY): Payer: Medicare Other

## 2021-02-02 ENCOUNTER — Encounter (HOSPITAL_COMMUNITY): Payer: Medicare Other

## 2021-02-04 ENCOUNTER — Encounter (HOSPITAL_COMMUNITY): Payer: Medicare Other | Admitting: Physical Therapy

## 2021-02-07 ENCOUNTER — Encounter (HOSPITAL_COMMUNITY): Payer: Medicare Other | Admitting: Physical Therapy

## 2021-02-09 ENCOUNTER — Encounter (HOSPITAL_COMMUNITY): Payer: Medicare Other

## 2021-02-11 ENCOUNTER — Encounter (HOSPITAL_COMMUNITY): Payer: Medicare Other

## 2021-02-14 ENCOUNTER — Encounter (HOSPITAL_COMMUNITY): Payer: Medicare Other

## 2021-02-16 ENCOUNTER — Encounter (HOSPITAL_COMMUNITY): Payer: Medicare Other

## 2021-02-18 ENCOUNTER — Encounter (HOSPITAL_COMMUNITY): Payer: Medicare Other | Admitting: Physical Therapy

## 2021-02-23 DIAGNOSIS — M1612 Unilateral primary osteoarthritis, left hip: Secondary | ICD-10-CM | POA: Diagnosis not present

## 2021-03-08 ENCOUNTER — Other Ambulatory Visit (HOSPITAL_COMMUNITY): Payer: Self-pay | Admitting: Physician Assistant

## 2021-03-08 DIAGNOSIS — R928 Other abnormal and inconclusive findings on diagnostic imaging of breast: Secondary | ICD-10-CM

## 2021-03-08 DIAGNOSIS — N6489 Other specified disorders of breast: Secondary | ICD-10-CM

## 2021-04-05 ENCOUNTER — Ambulatory Visit (HOSPITAL_COMMUNITY)
Admission: RE | Admit: 2021-04-05 | Discharge: 2021-04-05 | Disposition: A | Discharge status: Home / Self Care | Payer: Medicare Other | Source: Ambulatory Visit | Attending: Physician Assistant | Admitting: Physician Assistant

## 2021-04-05 DIAGNOSIS — R928 Other abnormal and inconclusive findings on diagnostic imaging of breast: Secondary | ICD-10-CM | POA: Diagnosis not present

## 2021-04-05 DIAGNOSIS — N6489 Other specified disorders of breast: Secondary | ICD-10-CM | POA: Diagnosis not present

## 2021-04-13 ENCOUNTER — Other Ambulatory Visit (HOSPITAL_COMMUNITY): Payer: Self-pay | Admitting: Physician Assistant

## 2021-04-13 DIAGNOSIS — N6489 Other specified disorders of breast: Secondary | ICD-10-CM

## 2021-04-23 DIAGNOSIS — E7849 Other hyperlipidemia: Secondary | ICD-10-CM | POA: Diagnosis not present

## 2021-04-23 DIAGNOSIS — I10 Essential (primary) hypertension: Secondary | ICD-10-CM | POA: Diagnosis not present

## 2021-06-29 ENCOUNTER — Other Ambulatory Visit (HOSPITAL_COMMUNITY): Payer: Self-pay | Admitting: Physician Assistant

## 2021-06-29 DIAGNOSIS — Z6822 Body mass index (BMI) 22.0-22.9, adult: Secondary | ICD-10-CM | POA: Diagnosis not present

## 2021-06-29 DIAGNOSIS — Z1389 Encounter for screening for other disorder: Secondary | ICD-10-CM | POA: Diagnosis not present

## 2021-06-29 DIAGNOSIS — Z0000 Encounter for general adult medical examination without abnormal findings: Secondary | ICD-10-CM | POA: Diagnosis not present

## 2021-06-29 DIAGNOSIS — E2839 Other primary ovarian failure: Secondary | ICD-10-CM

## 2021-06-29 DIAGNOSIS — E782 Mixed hyperlipidemia: Secondary | ICD-10-CM | POA: Diagnosis not present

## 2021-07-15 ENCOUNTER — Ambulatory Visit (HOSPITAL_COMMUNITY)
Admission: RE | Admit: 2021-07-15 | Discharge: 2021-07-15 | Disposition: A | Discharge status: Home / Self Care | Payer: Medicare Other | Source: Ambulatory Visit | Attending: Physician Assistant | Admitting: Physician Assistant

## 2021-07-15 ENCOUNTER — Other Ambulatory Visit: Payer: Self-pay

## 2021-07-15 DIAGNOSIS — E2839 Other primary ovarian failure: Secondary | ICD-10-CM | POA: Insufficient documentation

## 2021-07-15 DIAGNOSIS — M810 Age-related osteoporosis without current pathological fracture: Secondary | ICD-10-CM | POA: Diagnosis not present

## 2021-07-24 DIAGNOSIS — E7849 Other hyperlipidemia: Secondary | ICD-10-CM | POA: Diagnosis not present

## 2021-07-24 DIAGNOSIS — I10 Essential (primary) hypertension: Secondary | ICD-10-CM | POA: Diagnosis not present

## 2021-08-18 DIAGNOSIS — Z6823 Body mass index (BMI) 23.0-23.9, adult: Secondary | ICD-10-CM | POA: Diagnosis not present

## 2021-08-18 DIAGNOSIS — M810 Age-related osteoporosis without current pathological fracture: Secondary | ICD-10-CM | POA: Diagnosis not present

## 2021-08-30 ENCOUNTER — Ambulatory Visit (HOSPITAL_COMMUNITY)
Admission: RE | Admit: 2021-08-30 | Discharge: 2021-08-30 | Disposition: A | Discharge status: Home / Self Care | Payer: Medicare Other | Source: Ambulatory Visit | Attending: Physician Assistant | Admitting: Physician Assistant

## 2021-08-30 ENCOUNTER — Other Ambulatory Visit: Payer: Self-pay

## 2021-08-30 DIAGNOSIS — N6489 Other specified disorders of breast: Secondary | ICD-10-CM | POA: Diagnosis not present

## 2021-08-30 DIAGNOSIS — R922 Inconclusive mammogram: Secondary | ICD-10-CM | POA: Diagnosis not present

## 2021-09-02 ENCOUNTER — Other Ambulatory Visit: Payer: Self-pay | Admitting: Orthopedic Surgery

## 2021-09-02 DIAGNOSIS — Z98818 Other dental procedure status: Secondary | ICD-10-CM

## 2021-09-06 DIAGNOSIS — H04123 Dry eye syndrome of bilateral lacrimal glands: Secondary | ICD-10-CM | POA: Diagnosis not present

## 2021-09-21 DIAGNOSIS — Z23 Encounter for immunization: Secondary | ICD-10-CM | POA: Diagnosis not present

## 2021-12-03 IMAGING — US US BREAST*L* LIMITED INC AXILLA
1 series · 6 of 6 positions shown · non-contrast
Comparison: Previous exams.

CLINICAL DATA: Follow-up for probably benign left breast mass.

EXAM:
DIGITAL DIAGNOSTIC BILATERAL MAMMOGRAM WITH TOMO AND CAD; ULTRASOUND
LEFT BREAST LIMITED
LEFT BREAST ULTRASOUND

[Series 1: us breast*left* limited inc axilla · 0.07mm/px · 6 of 6 slices shown]
[im 1/6]
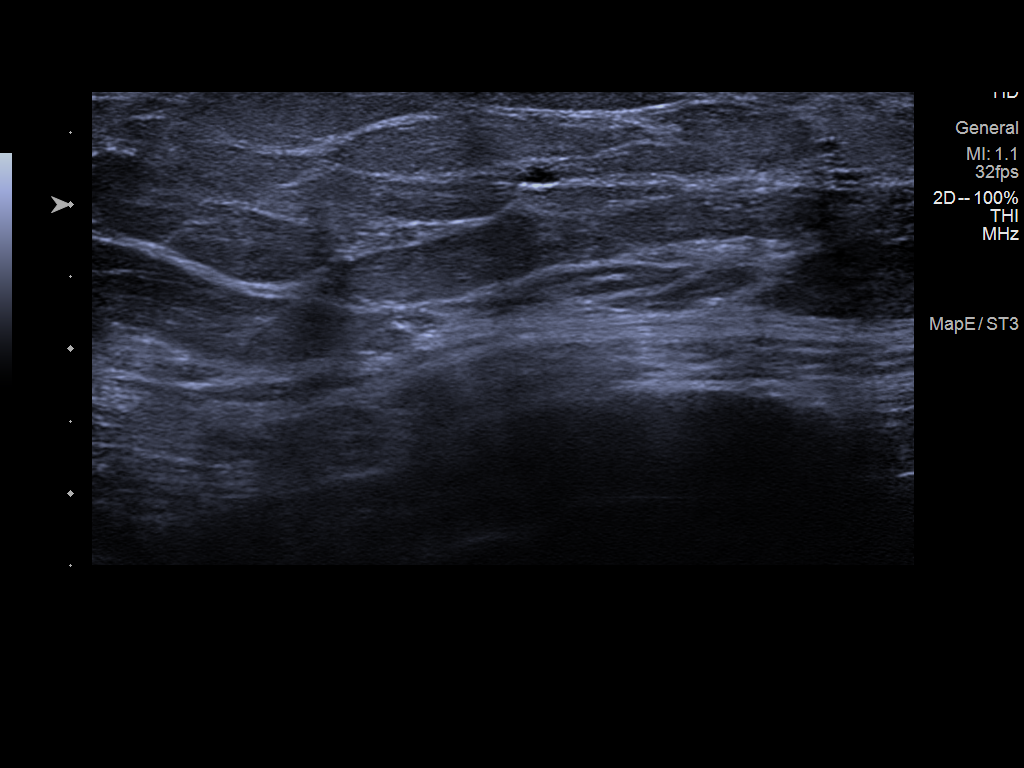
[im 2/6]
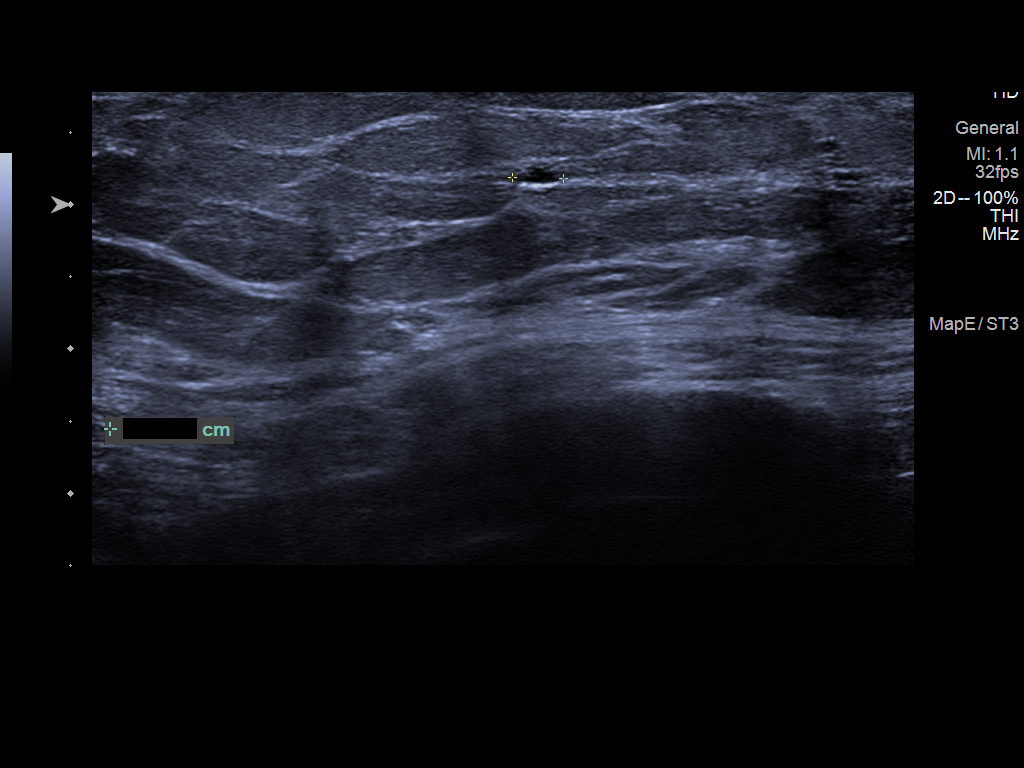
[im 3/6]
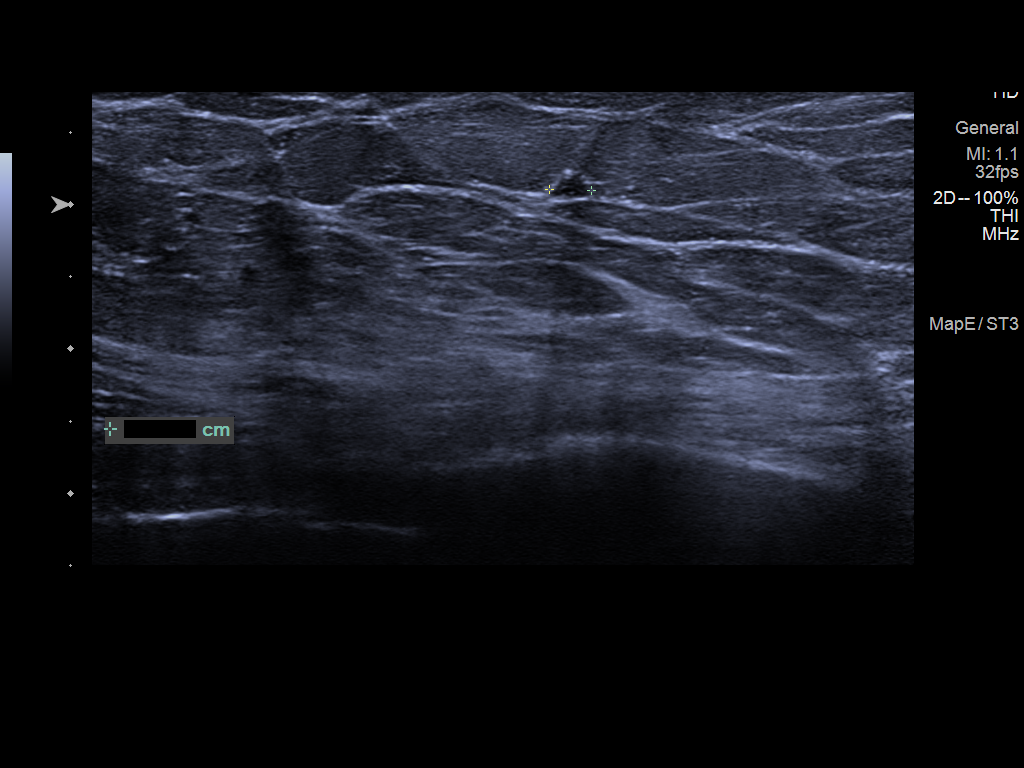
[im 4/6]
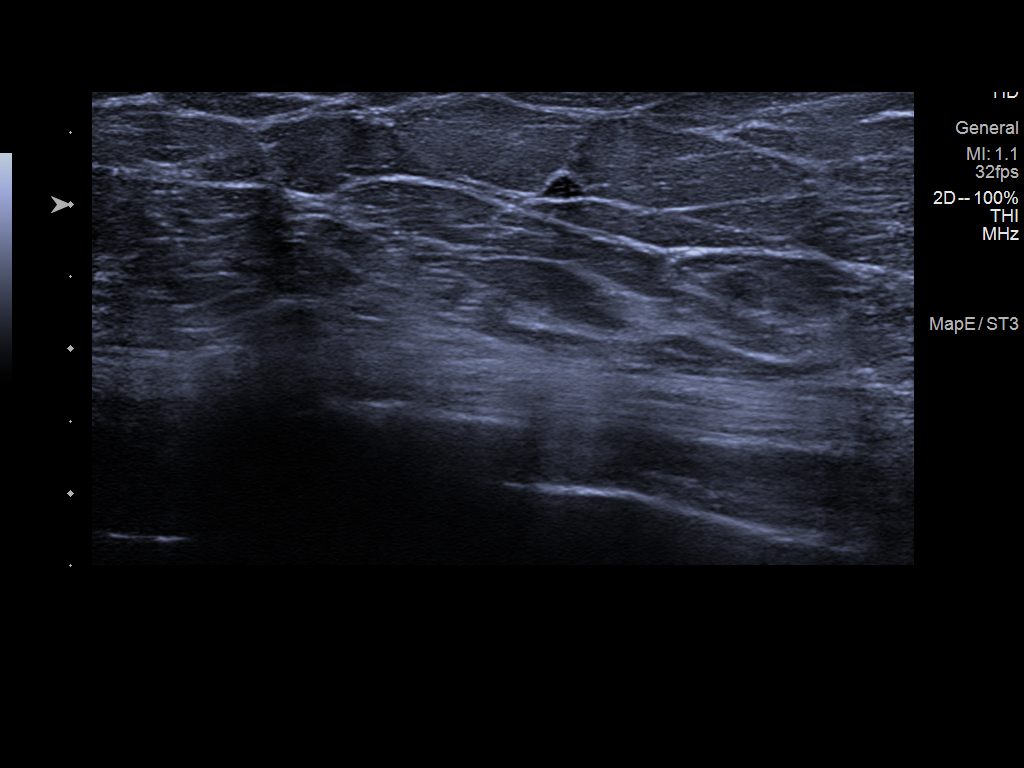
[im 5/6]
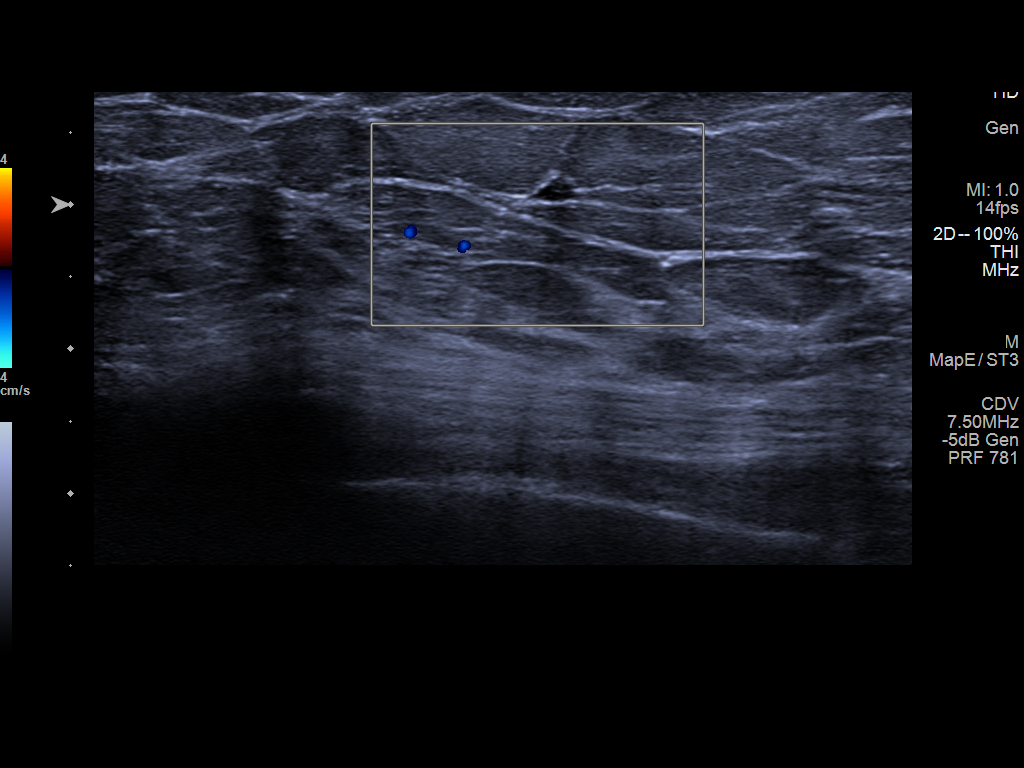
[im 6/6]
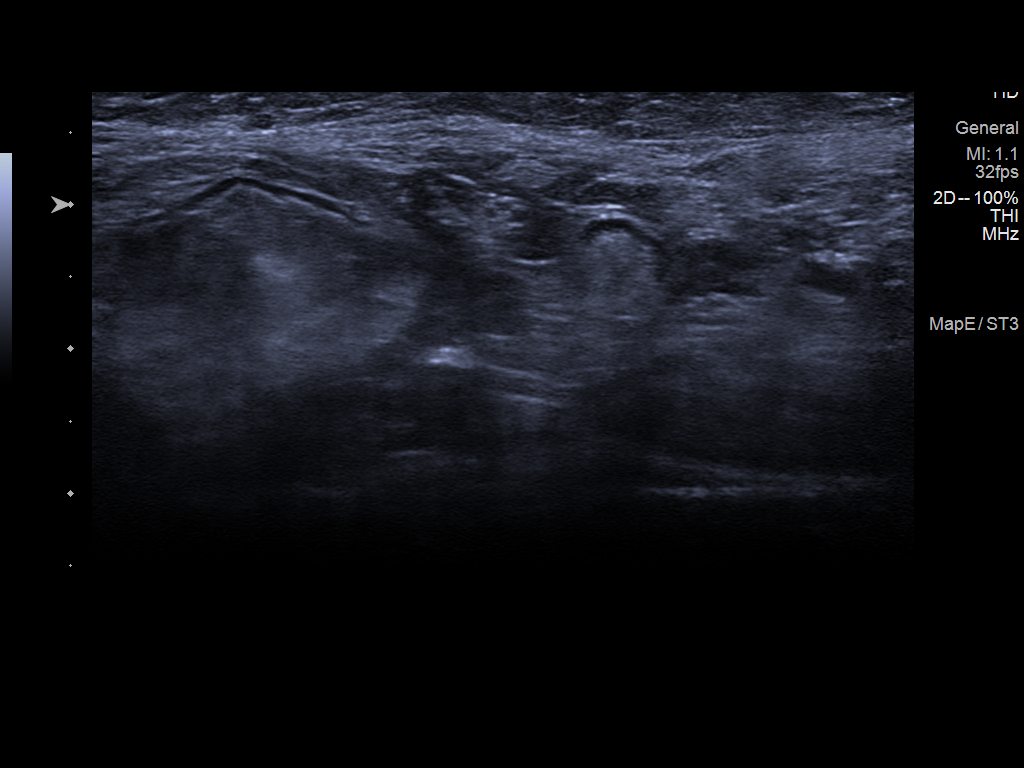

[6 of 6 positions shown; findings below may reference images not displayed]

ACR Breast Density Category b: There are scattered areas of
fibroglandular density.
FINDINGS: No suspicious masses or calcifications are seen in the right breast.
The previously seen probably benign mass in the far superior left
breast appears substantially smaller/near resolved. An initially
questioned asymmetry seen in the central left breast on the cc view
resolves on the additional imaging. There is a persistent/developing
asymmetry seen in the superior left breast mid depth on the MLO
tomograms that persists on the spot compression MLO and ML
tomograms.

Mammographic images were processed with CAD.

Targeted ultrasound of the left breast was performed. There is a
tiny cyst at 1 o'clock 3 cm from nipple measuring 0.4 x 0.2 x
cm, overall unchanged and considered benign given greater than 2
stability. No sonographic correlate for the developing asymmetry
seen in the upper-outer left breast on mammography. No
lymphadenopathy seen in the left axilla.
IMPRESSION: Suspicious developing asymmetry seen in the upper-outer left breast
on mammography only.

RECOMMENDATION:
Recommend stereotactic guided biopsy of the developing left breast
asymmetry.

I have discussed the findings and recommendations with the patient.
If applicable, a reminder letter will be sent to the patient
regarding the next appointment.

BI-RADS CATEGORY  4: Suspicious.

## 2022-01-13 DIAGNOSIS — M1612 Unilateral primary osteoarthritis, left hip: Secondary | ICD-10-CM | POA: Diagnosis not present

## 2022-01-30 ENCOUNTER — Other Ambulatory Visit: Payer: Self-pay

## 2022-01-30 ENCOUNTER — Ambulatory Visit (INDEPENDENT_AMBULATORY_CARE_PROVIDER_SITE_OTHER): Payer: Medicare Other | Admitting: Orthopedic Surgery

## 2022-01-30 DIAGNOSIS — M65331 Trigger finger, right middle finger: Secondary | ICD-10-CM | POA: Diagnosis not present

## 2022-01-30 NOTE — Progress Notes (Signed)
Chief Complaint  Patient presents with   Follow-up    Recheck trigger finger    History of trigger finger right long finger  Wishes to have another injection  Tenderness over the A1 pulley of the right long finger with no clicking or locking  Trigger finger injection  Diagnosis right long finger Procedure injection A1 pulley Medications lidocaine 1% 1 mL and Depo-Medrol 40 mg 1 mL Skin prep alcohol and ethyl chloride Verbal consent was obtained Timeout confirmed the injection site  After cleaning the skin with alcohol and anesthetizing the skin with ethyl chloride the A1 pulley was palpated and the injection was performed without complication  Encounter Diagnosis  Name Primary?   Trigger finger, right middle finger Yes

## 2022-06-17 IMAGING — RF DG HIP (WITH PELVIS) OPERATIVE*L*
1 series · 2 of 2 positions shown · non-contrast
Comparison: 10/07/2020

CLINICAL DATA: Left hip arthroplasty

EXAM:
OPERATIVE LEFT HIP (WITH PELVIS IF PERFORMED) AP VIEWS
TECHNIQUE: Fluoroscopic spot image(s) were submitted for interpretation
post-operatively.

[Series 1: unknown protocol · 0.20mm/px · 2 of 2 slices shown]
[im 1/2]
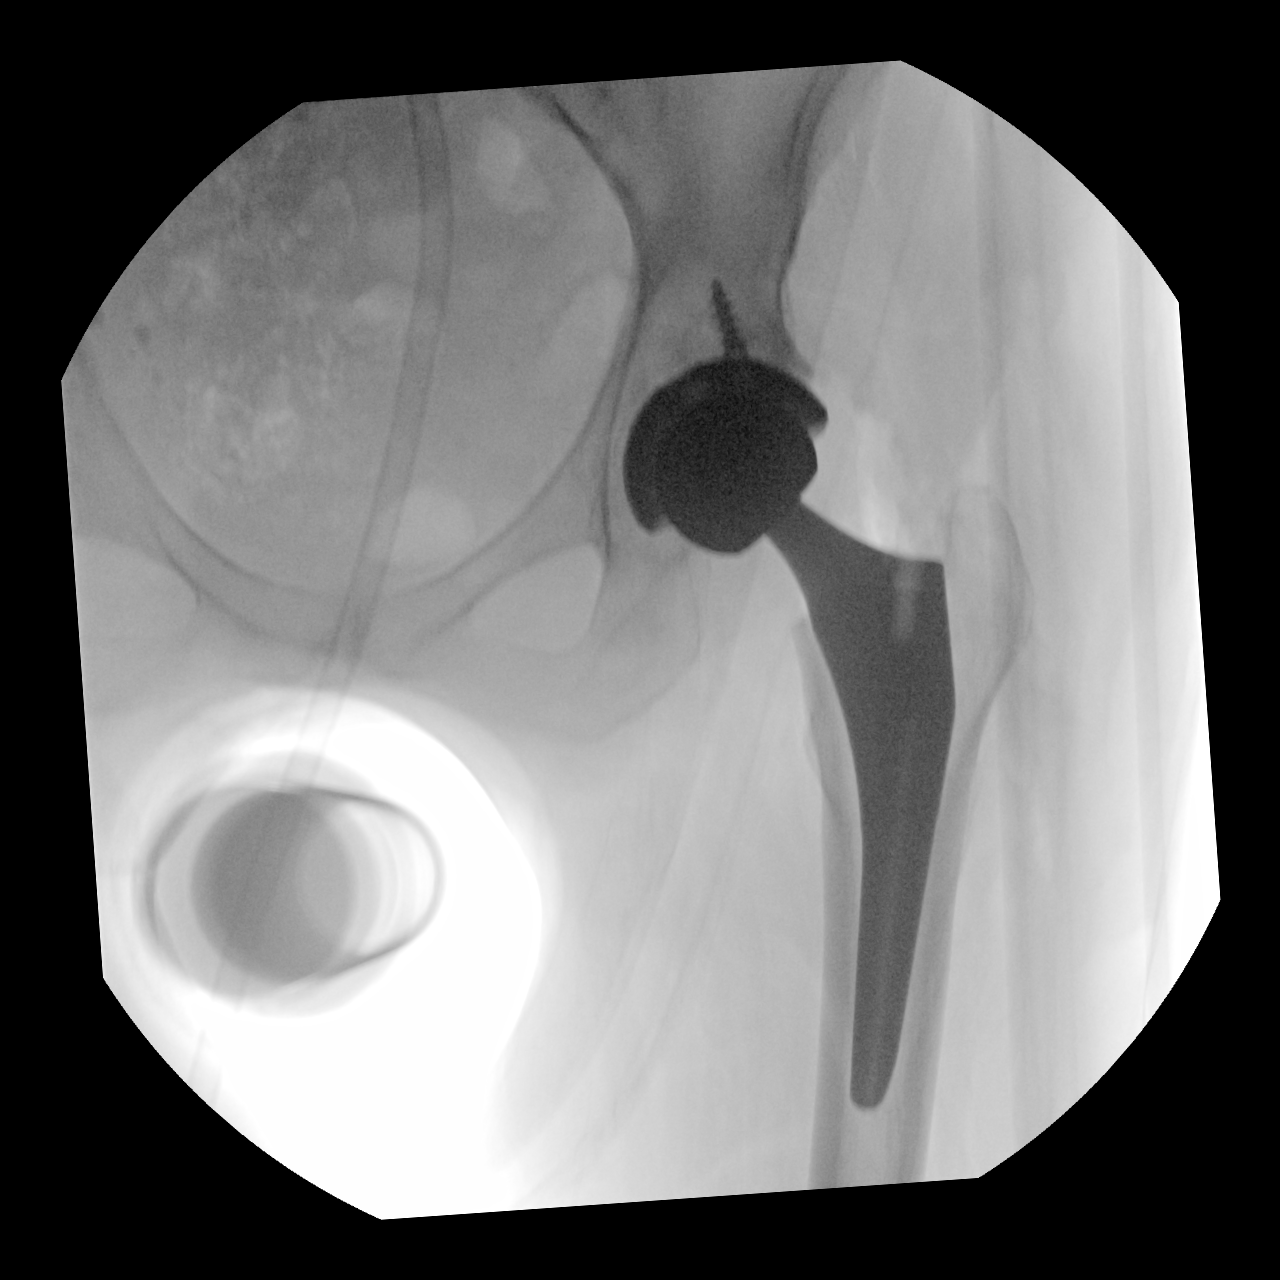
[im 2/2]
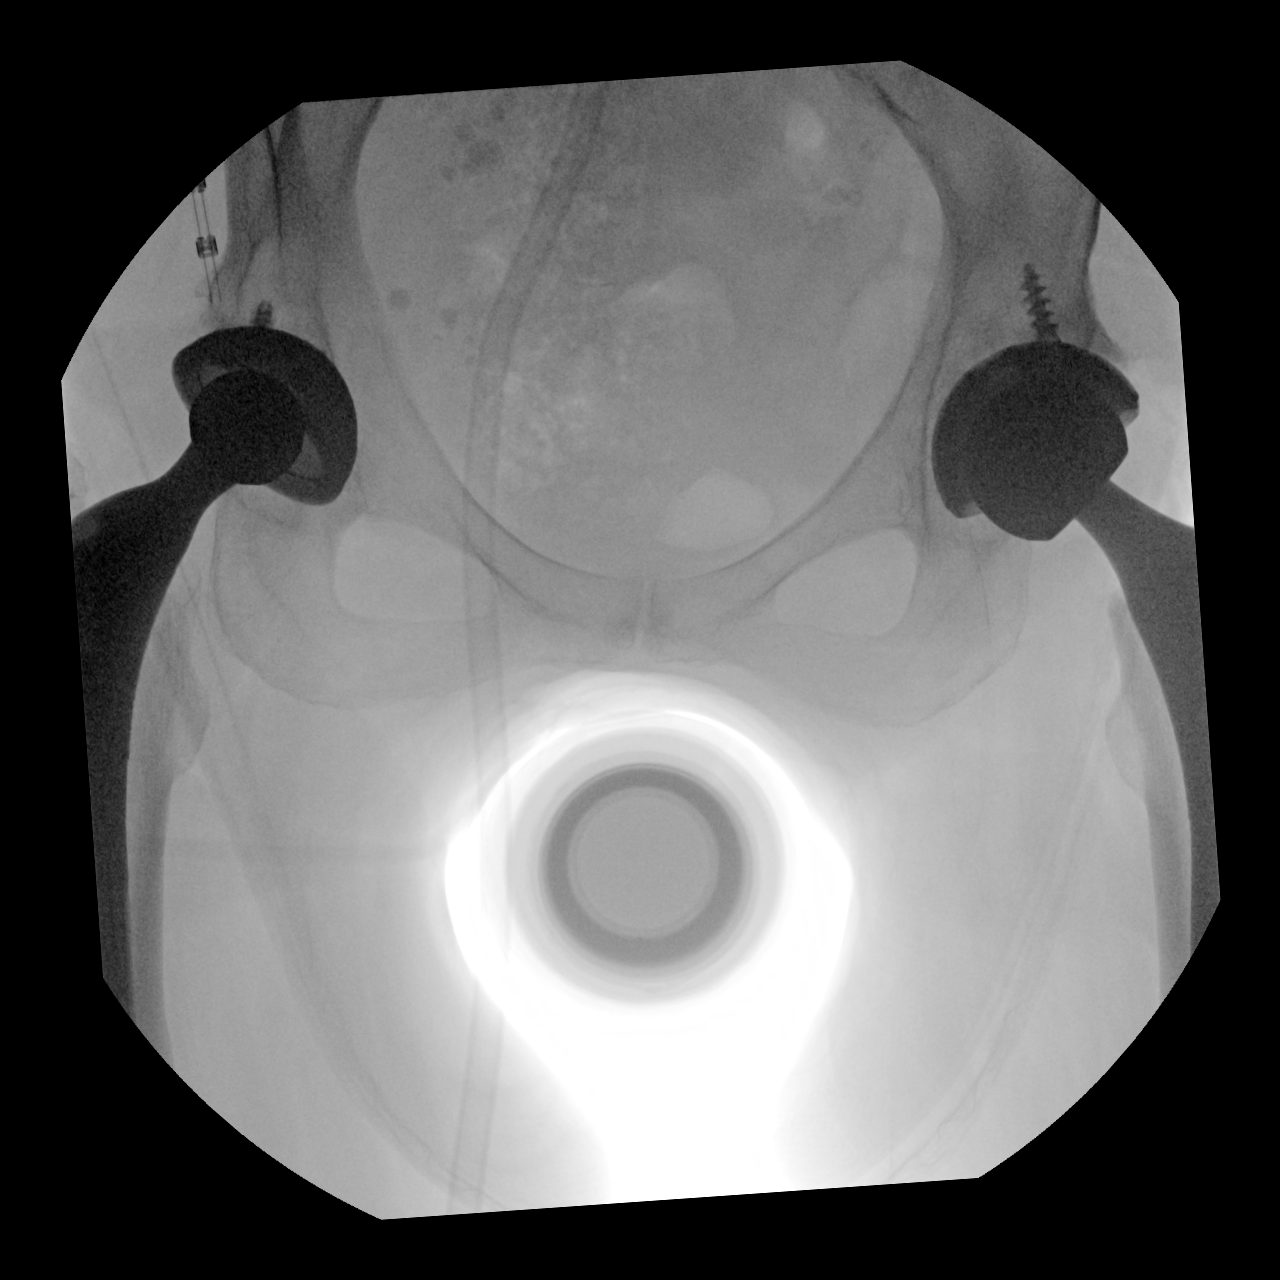

[2 of 2 positions shown; findings below may reference images not displayed]

FINDINGS: 2 C-arm fluoroscopic images were obtained intraoperatively and
submitted for post operative interpretation. Interval placement of
left total hip arthroplasty hardware. Hardware components appear in
their expected alignment. No apparent intraoperative complication.
Please see the performing provider's procedural report for further
detail.
IMPRESSION: As above.

## 2022-07-24 ENCOUNTER — Other Ambulatory Visit (HOSPITAL_COMMUNITY): Payer: Self-pay | Admitting: Internal Medicine

## 2022-07-24 DIAGNOSIS — Z1231 Encounter for screening mammogram for malignant neoplasm of breast: Secondary | ICD-10-CM

## 2022-08-18 DIAGNOSIS — I10 Essential (primary) hypertension: Secondary | ICD-10-CM | POA: Diagnosis not present

## 2022-08-18 DIAGNOSIS — E559 Vitamin D deficiency, unspecified: Secondary | ICD-10-CM | POA: Diagnosis not present

## 2022-08-18 DIAGNOSIS — E782 Mixed hyperlipidemia: Secondary | ICD-10-CM | POA: Diagnosis not present

## 2022-08-18 DIAGNOSIS — M810 Age-related osteoporosis without current pathological fracture: Secondary | ICD-10-CM | POA: Diagnosis not present

## 2022-08-18 DIAGNOSIS — E039 Hypothyroidism, unspecified: Secondary | ICD-10-CM | POA: Diagnosis not present

## 2022-08-18 DIAGNOSIS — D518 Other vitamin B12 deficiency anemias: Secondary | ICD-10-CM | POA: Diagnosis not present

## 2022-08-18 DIAGNOSIS — Z0001 Encounter for general adult medical examination with abnormal findings: Secondary | ICD-10-CM | POA: Diagnosis not present

## 2022-08-25 DIAGNOSIS — Z1211 Encounter for screening for malignant neoplasm of colon: Secondary | ICD-10-CM | POA: Diagnosis not present

## 2022-08-25 DIAGNOSIS — Z1212 Encounter for screening for malignant neoplasm of rectum: Secondary | ICD-10-CM | POA: Diagnosis not present

## 2022-09-01 ENCOUNTER — Ambulatory Visit (HOSPITAL_COMMUNITY): Payer: Medicare Other

## 2022-09-09 IMAGING — US US BREAST*L* LIMITED INC AXILLA
1 series · 2 of 2 positions shown · non-contrast
Comparison: Previous exams.

CLINICAL DATA: Follow-up after benign stereotactic biopsy of an
asymmetry in the upper-outer left breast with pathology revealing
benign breast tissue. The patient states following her biopsy she
experienced significant bruising and continued bleeding.

EXAM:
DIGITAL DIAGNOSTIC UNILATERAL LEFT MAMMOGRAM WITH TOMOSYNTHESIS AND
CAD; ULTRASOUND LEFT BREAST LIMITED
TECHNIQUE: Left digital diagnostic mammography and breast tomosynthesis was
performed. The images were evaluated with computer-aided detection.;
Targeted ultrasound examination of the left breast was performed

[Series 1: us breast*left* limited inc axilla · 0.07mm/px · 2 of 2 slices shown]
[im 1/2]
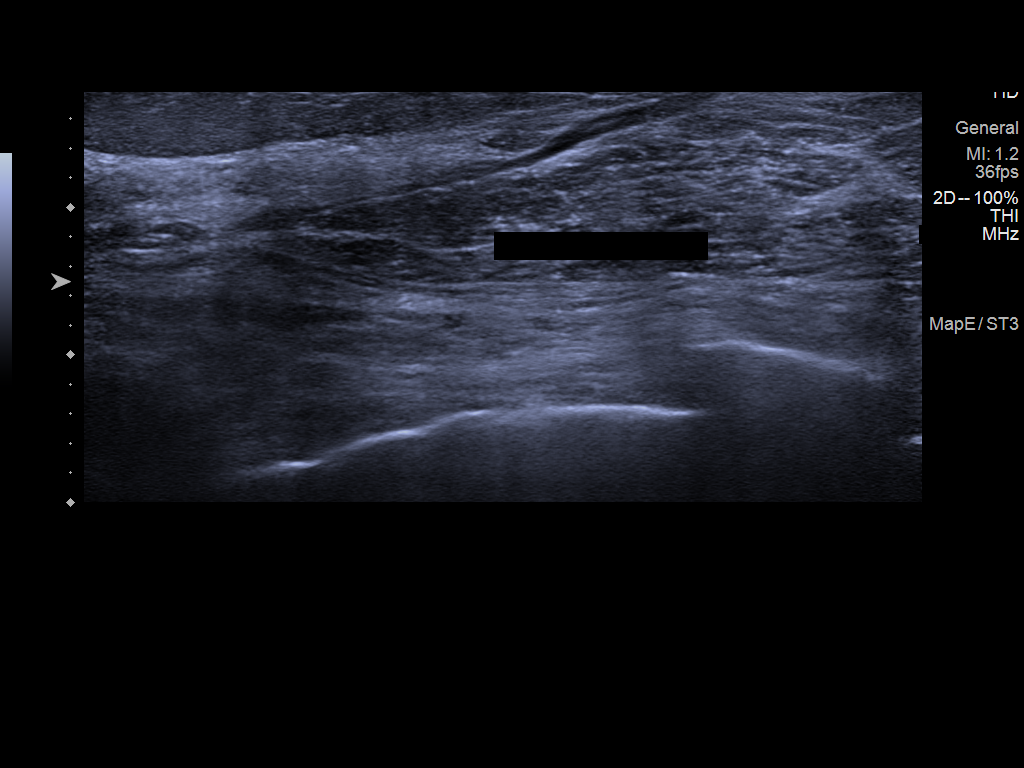
[im 2/2]
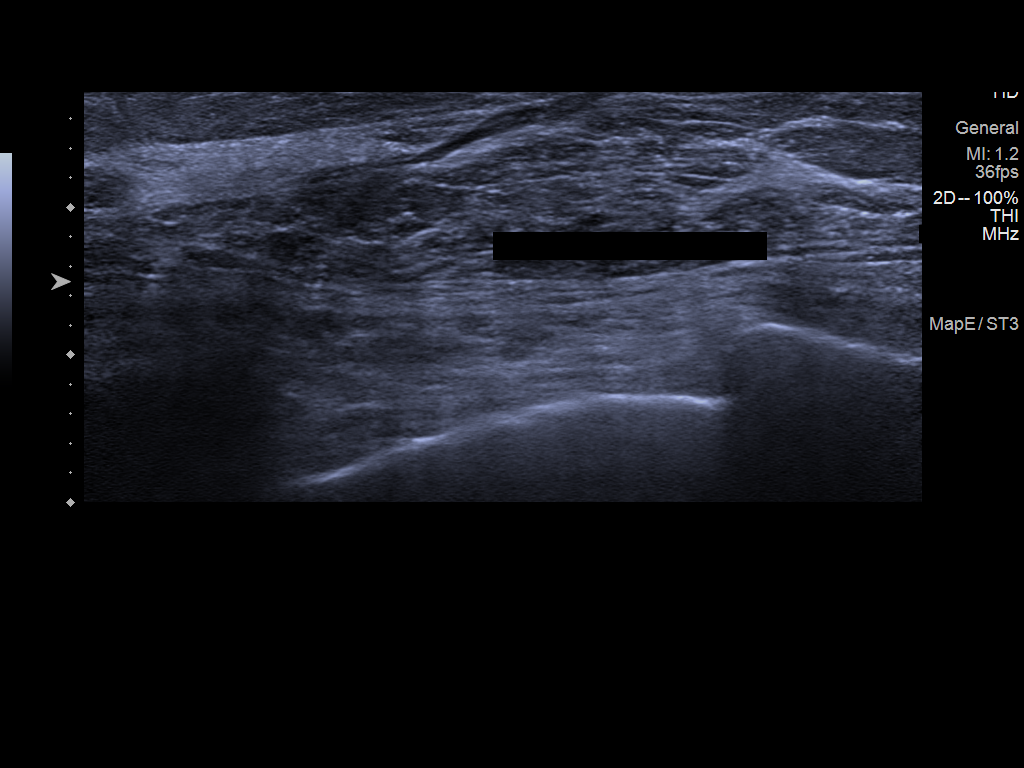

[2 of 2 positions shown; findings below may reference images not displayed]

ACR Breast Density Category b: There are scattered areas of
fibroglandular density.
FINDINGS: A coil shaped biopsy marking clip is present in the upper-outer left
breast at site of prior benign biopsy with mild associated increased
density and distortion, felt to be related to post biopsy change. No
suspicious masses or calcifications identified.

Targeted ultrasound of the upper-outer quadrant of the left breast
was performed. There is a thin linear tract extending from the skin
through the upper-outer left breast at the approximate 2 o'clock
position 4 cm from the nipple most compatible with biopsy related
scarring. No suspicious masses or abnormality seen in the
upper-outer left breast.
IMPRESSION: Biopsy related change/scarring at site of prior benign biopsy in the
upper-outer left breast.

RECOMMENDATION:
Recommend short-term follow-up diagnostic mammography for the likely
biopsy related change in the upper-outer left breast. Patient is due
for annual bilateral mammography June 2021, and therefore follow-up
can be performed at this time.

I have discussed the findings and recommendations with the patient.
If applicable, a reminder letter will be sent to the patient
regarding the next appointment.

BI-RADS CATEGORY  3: Probably benign.

## 2022-09-19 DIAGNOSIS — H04123 Dry eye syndrome of bilateral lacrimal glands: Secondary | ICD-10-CM | POA: Diagnosis not present

## 2022-09-22 ENCOUNTER — Ambulatory Visit (HOSPITAL_COMMUNITY)
Admission: RE | Admit: 2022-09-22 | Discharge: 2022-09-22 | Disposition: A | Discharge status: Home / Self Care | Payer: Medicare Other | Source: Ambulatory Visit | Attending: Internal Medicine | Admitting: Internal Medicine

## 2022-09-22 DIAGNOSIS — Z1231 Encounter for screening mammogram for malignant neoplasm of breast: Secondary | ICD-10-CM | POA: Diagnosis not present

## 2022-11-24 ENCOUNTER — Other Ambulatory Visit: Payer: Self-pay | Admitting: Orthopedic Surgery

## 2022-11-24 DIAGNOSIS — Z98818 Other dental procedure status: Secondary | ICD-10-CM

## 2023-01-05 DIAGNOSIS — M1612 Unilateral primary osteoarthritis, left hip: Secondary | ICD-10-CM | POA: Diagnosis not present

## 2023-02-22 ENCOUNTER — Encounter: Payer: Self-pay | Admitting: Radiology

## 2023-04-13 ENCOUNTER — Encounter: Payer: Self-pay | Admitting: Orthopedic Surgery

## 2023-04-13 ENCOUNTER — Ambulatory Visit (INDEPENDENT_AMBULATORY_CARE_PROVIDER_SITE_OTHER): Payer: Medicare Other | Admitting: Orthopedic Surgery

## 2023-04-13 VITALS — BP 144/82 | HR 67 | Ht 65.0 in | Wt 139.0 lb

## 2023-04-13 DIAGNOSIS — M65342 Trigger finger, left ring finger: Secondary | ICD-10-CM | POA: Diagnosis not present

## 2023-04-13 NOTE — Progress Notes (Addendum)
Office Visit Note   Patient: Brenda [name]           Date of Birth: [birth year]           MRN: [id number] Visit Date: 04/13/2023 Requested by: Elfredia Nevins, MD [address] [place],  [place] [address] PCP: Elfredia Nevins, MD  Subjective: Chief Complaint  Patient presents with   Hand Pain    Left / middle ring and pinky fingers painful stiff decreased ROM    HPI: 77 year old female presents with palmar pain left hand left long (middle) ring and small no catching or locking but decreased flexion              ROS: Negative  Assessment & Plan:  Images personally read and my interpretation : No images  Visit Diagnoses:  1. Trigger finger, left ring finger    Trigger finger injection  Diagnosis tenosynovitis left ring finger Procedure injection A1 pulley Medications lidocaine 1% 1 mL and Depo-Medrol 40 mg 1 mL Skin prep alcohol and ethyl chloride Verbal consent was obtained Timeout confirmed the injection site  After cleaning the skin with alcohol and anesthetizing the skin with ethyl chloride the A1 pulley was palpated and the injection was performed without complication  Plan: Inject left ring finger  Follow-Up Instructions: Return if symptoms worsen or fail to improve.   Orders:  No orders of the defined types were placed in this encounter.     Objective: Vital Signs: BP (!) 144/82   Pulse 67   Ht 5\' 5"  (1.651 m)   Wt 139 lb (63 kg)   BMI 23.13 kg/m   Physical Exam Vitals and nursing note reviewed.  Constitutional:      Appearance: Normal appearance.  HENT:     Head: Normocephalic and atraumatic.  Eyes:     General: No scleral icterus.       Right eye: No discharge.        Left eye: No discharge.     Extraocular Movements: Extraocular movements intact.     Conjunctiva/sclera: Conjunctivae normal.     Pupils: Pupils are equal, round, and reactive to light.  Cardiovascular:     Rate and Rhythm: Normal rate.     Pulses: Normal pulses.   Skin:    General: Skin is warm and dry.     Capillary Refill: Capillary refill takes less than 2 seconds.  Neurological:     General: No focal deficit present.     Mental Status: She is alert and oriented to person, place, and time.  Psychiatric:        Mood and Affect: Mood normal.        Behavior: Behavior normal.        Thought Content: Thought content normal.        Judgment: Judgment normal.      Right Hand Exam   Comments:  Left hand tenderness over the ring finger and some over the small finger some over the long finger but mostly over the ring finger decreased range of motion seen in those 3 digits as well but no neurovascular compromise no tendon compromise       Specialty Comments:  No specialty comments available.  Imaging: No results found.   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  Past Surgical History:  Procedure Laterality Date   ABDOMINAL HYSTERECTOMY     BREAST EXCISIONAL BIOPSY Right    CATARACT EXTRACTION W/PHACO Right 02/05/2017   Procedure: CATARACT EXTRACTION PHACO AND INTRAOCULAR LENS PLACEMENT RIGHT EYE;  Surgeon: Gemma Payor, MD;  Location: [facility];  Service: Ophthalmology;  Laterality: Right;  CDE:  6.01   CATARACT EXTRACTION W/PHACO Left 01/15/2017   Procedure: CATARACT EXTRACTION PHACO AND INTRAOCULAR LENS PLACEMENT (IOC);  Surgeon: Gemma Payor, MD;  Location: [facility];  Service: Ophthalmology;  Laterality: Left;  [id number]   JOINT REPLACEMENT     bilateral knees adn right hip   REPLACEMENT TOTAL KNEE BILATERAL     TOTAL HIP ARTHROPLASTY Right    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

## 2023-08-10 ENCOUNTER — Other Ambulatory Visit (HOSPITAL_COMMUNITY): Payer: Self-pay | Admitting: Internal Medicine

## 2023-08-10 DIAGNOSIS — Z1231 Encounter for screening mammogram for malignant neoplasm of breast: Secondary | ICD-10-CM

## 2023-08-24 DIAGNOSIS — Z0001 Encounter for general adult medical examination with abnormal findings: Secondary | ICD-10-CM | POA: Diagnosis not present

## 2023-08-24 DIAGNOSIS — I10 Essential (primary) hypertension: Secondary | ICD-10-CM | POA: Diagnosis not present

## 2023-08-24 DIAGNOSIS — E559 Vitamin D deficiency, unspecified: Secondary | ICD-10-CM | POA: Diagnosis not present

## 2023-08-24 DIAGNOSIS — E039 Hypothyroidism, unspecified: Secondary | ICD-10-CM | POA: Diagnosis not present

## 2023-08-24 DIAGNOSIS — D518 Other vitamin B12 deficiency anemias: Secondary | ICD-10-CM | POA: Diagnosis not present

## 2023-08-24 DIAGNOSIS — M810 Age-related osteoporosis without current pathological fracture: Secondary | ICD-10-CM | POA: Diagnosis not present

## 2023-08-24 DIAGNOSIS — E782 Mixed hyperlipidemia: Secondary | ICD-10-CM | POA: Diagnosis not present

## 2023-08-24 DIAGNOSIS — Z6822 Body mass index (BMI) 22.0-22.9, adult: Secondary | ICD-10-CM | POA: Diagnosis not present

## 2023-09-25 NOTE — Progress Notes (Signed)
Name: Brenda [name] DOB: June 11, 1946 MRN: [id number]  History of Present Illness: Brenda [name] is a 77 y.o. female who presents today as a new patient at [facility] . All available relevant medical records have been reviewed.   She reports chief complaint of mixed urinary incontinence.  Reports urge incontinence and stress incontinence. She reports the UUI is predominant.  She leaks multiple times per day; wearing several pads per day.  She reports urinary frequency, nocturia x1-2, and urgency.  She denies caffeine intake.  She reports that she previously tried Oxybutynin 5 mg 2x/day which helped somewhat at first but then the benefit stopped and she was having significant dry mouth with it so it was discontinued.   She denies dysuria, gross hematuria, straining to void, or sensations of incomplete emptying.   Fall Screening: Do you usually have a device to assist in your mobility? No   Medications: Current Outpatient Medications  Medication Sig Dispense Refill   acetaminophen (TYLENOL) 500 MG tablet Take 1,000 mg by mouth every 6 (six) hours.     Ascorbic Acid (VITAMIN C) 500 MG CHEW Chew 1,000 mg by mouth daily.     Calcium Carbonate (CALTRATE 600 PO) Take 600 mg by mouth daily at 12 noon. Chew     Carboxymethylcellulose Sodium (THERATEARS) 0.25 % SOLN Place 1 drop into both eyes in the morning, at noon, and at bedtime.     cholecalciferol (VITAMIN D3) 25 MCG (1000 UNIT) tablet Take 1,000 Units by mouth daily.     clindamycin (CLEOCIN) 300 MG capsule TAKE TWO CAPSULES BY MOUTH 30 MINUTES PRIOR TO DENTAL PROCEDURE 2 capsule 5   DHA-EPA-Flaxseed Oil-Vitamin E (THERATEARS NUTRITION) CAPS Take 3 capsules by mouth daily.     hydrochlorothiazide (HYDRODIURIL) 25 MG tablet Take 25 mg by mouth in the morning and at bedtime.     Pediatric Multivitamins-Iron (FLINTSTONES PLUS IRON PO) Take 1 tablet by mouth daily.     pyridOXINE (VITAMIN B-6) 100 MG tablet Take 200 mg  by mouth daily.     Vibegron (GEMTESA) 75 MG TABS Take 1 tablet (75 mg total) by mouth daily. 30 tablet 11   vitamin B-12 (CYANOCOBALAMIN) 1000 MCG tablet Take 1,000 mcg by mouth daily.     omeprazole (PRILOSEC) 20 MG capsule Take 1 capsule (20 mg total) by mouth daily. 30 capsule 1   No current facility-administered medications for this visit.    Allergies: Allergies  Allergen Reactions   Codeine Rash   Penicillins Rash    Has patient had a PCN reaction causing immediate rash, facial/tongue/throat swelling, SOB or lightheadedness with hypotension:unknown Has patient had a PCN reaction causing severe rash involving mucus membranes or skin necrosis:No Has patient had a PCN reaction that required hospitalization No Has patient had a PCN reaction occurring within the last 10 years: No If all of the above answers are "NO", then may proceed with Cephalosporin use.     Past Medical History:  Diagnosis Date   Arthritis    Family history of adverse reaction to anesthesia    sister- N/V    Heart murmur    Hypertension    PONV (postoperative nausea and vomiting)    Past Surgical History:  Procedure Laterality Date   ABDOMINAL HYSTERECTOMY     BREAST EXCISIONAL BIOPSY Right    CATARACT EXTRACTION W/PHACO Right 02/05/2017   Procedure: CATARACT EXTRACTION PHACO AND INTRAOCULAR LENS PLACEMENT RIGHT EYE;  Surgeon: Gemma Payor, MD;  Location: [facility];  Service:  Ophthalmology;  Laterality: Right;  CDE:  6.01   CATARACT EXTRACTION W/PHACO Left 01/15/2017   Procedure: CATARACT EXTRACTION PHACO AND INTRAOCULAR LENS PLACEMENT (IOC);  Surgeon: Gemma Payor, MD;  Location: [facility];  Service: Ophthalmology;  Laterality: Left;  cde-7.04   JOINT REPLACEMENT     bilateral knees adn right hip   REPLACEMENT TOTAL KNEE BILATERAL     TOTAL HIP ARTHROPLASTY Right    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   Procedure: TOTAL HIP ARTHROPLASTY ANTERIOR APPROACH;  Surgeon: Sheral Apley, MD;  Location: [facility];   Service: Orthopedics;  Laterality: Left;   History reviewed. No pertinent family history. Social History   Socioeconomic History   Marital status: Divorced    Spouse name: Not on file   Number of children: Not on file   Years of education: Not on file   Highest education level: Not on file  Occupational History   Not on file  Tobacco Use   Smoking status: Never   Smokeless tobacco: Never  Vaping Use   Vaping status: Never Used  Substance and Sexual Activity   Alcohol use: No   Drug use: No   Sexual activity: Not Currently    Birth control/protection: Surgical  Other Topics Concern   Not on file  Social History Narrative   Not on file   Social Determinants of Health   Financial Resource Strain: Not on file  Food Insecurity: Not on file  Transportation Needs: Not on file  Physical Activity: Not on file  Stress: Not on file  Social Connections: Not on file  Intimate Partner Violence: Not on file    SUBJECTIVE  Review of Systems Constitutional: Patient denies any unintentional weight loss or change in strength lntegumentary: Patient denies any rashes or pruritus Eyes: Patient reports dry eyes ENT: Patient reports dry mouth Cardiovascular: Patient denies chest pain or syncope Respiratory: Patient denies shortness of breath Gastrointestinal: Patient denies nausea, vomiting, constipation, or diarrhea Musculoskeletal: Patient denies muscle cramps or weakness Neurologic: Patient denies convulsions or seizures Allergic/Immunologic: Patient denies recent allergic reaction(s) Hematologic/Lymphatic: Patient denies bleeding tendencies Endocrine: Patient denies heat/cold intolerance  GU: As per HPI.  OBJECTIVE Vitals:   09/28/23 1028  BP: 136/75  Pulse: 64  Temp: 98.1 F (36.7 C)   There is no height or weight on file to calculate BMI.  Physical Examination Constitutional: No obvious distress; patient is non-toxic appearing  Cardiovascular: No visible lower  extremity edema.  Respiratory: The patient does not have audible wheezing/stridor; respirations do not appear labored  Gastrointestinal: Abdomen non-distended Musculoskeletal: Normal ROM of UEs  Skin: No obvious rashes/open sores  Neurologic: CN 2-12 grossly intact Psychiatric: Answered questions appropriately with normal affect  Hematologic/Lymphatic/Immunologic: No obvious bruises or sites of spontaneous bleeding  UA: 6-10 WBC/hpf, 0-2 RBC/hpf, no bacteria  PVR: 0 ml  ASSESSMENT Urge incontinence - Plan: Urinalysis, Routine w reflex microscopic, BLADDER SCAN AMB NON-IMAGING, Vibegron (GEMTESA) 75 MG TABS  Stress incontinence, female - Plan: Urinalysis, Routine w reflex microscopic, BLADDER SCAN AMB NON-IMAGING  OAB (overactive bladder) - Plan: Urinalysis, Routine w reflex microscopic, BLADDER SCAN AMB NON-IMAGING, Vibegron (GEMTESA) 75 MG TABS  Dry mouth and eyes - Plan: Vibegron (GEMTESA) 75 MG TABS  Urinary urgency - Plan: Vibegron (GEMTESA) 75 MG TABS  Urinary frequency - Plan: Vibegron (GEMTESA) 75 MG TABS  We discussed the different forms of urinary incontinence, such as stress and urge incontinence, and described how symptoms are consistent with mixed urinary incontinence.  1. For  treatment of stress urinary incontinence: We discussed expectant management versus nonsurgical options versus surgery. Nonsurgical options include weight loss, Kegel exercises, with or without physical therapy, as well as an incontinence pessary. Surgical options may include midurethral sling, Burch urethropexy, autologous fascial sling, or transurethral injection of a bulking agent.   She elected to proceed with expectant management for SUI.  2. For treatment of OAB with urinary frequency, nocturia, urgency, and urge incontinence: We discussed the symptoms of overactive bladder (OAB), which include urinary urgency, frequency, nocturia, with or without urge incontinence.   We discussed the  following management options in detail including potential benefits, risks, and side effects: Behavioral therapy: Modify fluid intake Decreasing bladder irritants (such as caffeine) Bladder retraining / timed voiding Medication(s): - Not a safe candidate for anticholinergic medications due to age and pre-existing dry eyes and dry mouth.  - For beta-3 agonist medication, we discussed the risk for urinary retention and the potential side effect of elevated blood pressure specific to Myrbetriq (which is more likely to occur in individuals with uncontrolled hypertension).  For refractory cases: PTNS (posterior tibial nerve stimulation) Sacral neuromodulation trial (Medtronic lnterStim or Axonics implant) Bladder Botox injections In extreme cases, SP tube placement  She decided to proceed with Gemtesa 75 mg daily.   Will plan for follow up in 8 weeks or sooner if needed. Pt verbalized understanding and agreement. All questions were answered.  PLAN Advised the following: Start Gemtesa 75 mg daily.  Return in about 8 weeks (around 11/23/2023) for UA, PVR, & f/u with Evette Georges NP.  Orders Placed This Encounter  Procedures   Urinalysis, Routine w reflex microscopic   BLADDER SCAN AMB NON-IMAGING    It has been explained that the patient is to follow regularly with their PCP in addition to all other providers involved in their care and to follow instructions provided by these respective offices. Patient advised to contact urology clinic if any urologic-pertaining questions, concerns, new symptoms or problems arise in the interim period.  Patient Instructions                 Electronically signed by:  Donnita Falls, MSN, FNP-C, CUNP 09/28/2023 11:13 AM

## 2023-09-28 ENCOUNTER — Ambulatory Visit (INDEPENDENT_AMBULATORY_CARE_PROVIDER_SITE_OTHER): Payer: 59 | Admitting: Urology

## 2023-09-28 ENCOUNTER — Ambulatory Visit (HOSPITAL_COMMUNITY)
Admission: RE | Admit: 2023-09-28 | Discharge: 2023-09-28 | Disposition: A | Discharge status: Home / Self Care | Payer: 59 | Source: Ambulatory Visit | Attending: Internal Medicine | Admitting: Internal Medicine

## 2023-09-28 ENCOUNTER — Encounter: Payer: Self-pay | Admitting: Urology

## 2023-09-28 VITALS — BP 136/75 | HR 64 | Temp 98.1°F

## 2023-09-28 DIAGNOSIS — Z1231 Encounter for screening mammogram for malignant neoplasm of breast: Secondary | ICD-10-CM | POA: Diagnosis not present

## 2023-09-28 DIAGNOSIS — R350 Frequency of micturition: Secondary | ICD-10-CM | POA: Diagnosis not present

## 2023-09-28 DIAGNOSIS — N3281 Overactive bladder: Secondary | ICD-10-CM | POA: Diagnosis not present

## 2023-09-28 DIAGNOSIS — R682 Dry mouth, unspecified: Secondary | ICD-10-CM

## 2023-09-28 DIAGNOSIS — N393 Stress incontinence (female) (male): Secondary | ICD-10-CM | POA: Insufficient documentation

## 2023-09-28 DIAGNOSIS — N3941 Urge incontinence: Secondary | ICD-10-CM | POA: Insufficient documentation

## 2023-09-28 DIAGNOSIS — N3946 Mixed incontinence: Secondary | ICD-10-CM

## 2023-09-28 DIAGNOSIS — R3915 Urgency of urination: Secondary | ICD-10-CM

## 2023-09-28 LAB — URINALYSIS, ROUTINE W REFLEX MICROSCOPIC
Bilirubin, UA: NEGATIVE
Glucose, UA: NEGATIVE
Ketones, UA: NEGATIVE
Nitrite, UA: NEGATIVE
Protein,UA: NEGATIVE
RBC, UA: NEGATIVE
Specific Gravity, UA: <1.005 — ABNORMAL LOW (ref 1.005–1.030)
Urobilinogen, Ur: 0.2 mg/dL (ref 0.2–1.0)
pH, UA: 6.5 (ref 5.0–7.5)

## 2023-09-28 LAB — BLADDER SCAN AMB NON-IMAGING: Scan Result: 0

## 2023-09-28 LAB — MICROSCOPIC EXAMINATION: Bacteria, UA: NONE SEEN

## 2023-09-28 MED ORDER — GEMTESA 75 MG PO TABS: 1.0000 | ORAL_TABLET | Freq: Every day | ORAL | 11 refills | Status: DC

## 2023-11-15 DIAGNOSIS — H04123 Dry eye syndrome of bilateral lacrimal glands: Secondary | ICD-10-CM | POA: Diagnosis not present

## 2023-11-23 ENCOUNTER — Ambulatory Visit: Payer: 59 | Admitting: Urology

## 2023-11-28 ENCOUNTER — Ambulatory Visit: Payer: 59 | Admitting: Urology

## 2023-12-08 ENCOUNTER — Ambulatory Visit
Admission: EM | Admit: 2023-12-08 | Discharge: 2023-12-08 | Disposition: A | Discharge status: Home / Self Care | Payer: 59 | Attending: Family Medicine | Admitting: Family Medicine

## 2023-12-08 DIAGNOSIS — J22 Unspecified acute lower respiratory infection: Secondary | ICD-10-CM

## 2023-12-08 MED ORDER — PROMETHAZINE-DM 6.25-15 MG/5ML PO SYRP: 5.0000 mL | ORAL_SOLUTION | Freq: Four times a day (QID) | ORAL | 0 refills | Status: DC | PRN

## 2023-12-08 MED ORDER — AZITHROMYCIN 250 MG PO TABS: ORAL_TABLET | ORAL | 0 refills | Status: DC

## 2023-12-08 NOTE — ED Provider Notes (Signed)
[facility]    CSN: [id number] Arrival date & time: 12/08/23  0947      History   Chief Complaint No chief complaint on file.   HPI Brenda [name] is a 77 y.o. female.   Patient presenting today with 2-week history of progressively worsening productive cough, chest tightness, and now rib soreness and a headache from the amount of coughing.  Having chills last few days additionally.  Denies chest pain, abdominal pain, nausea vomiting diarrhea, fever.  So far trying numerous over-the-counter remedies with minimal relief.  No known history of chronic pulmonary disease.    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    Past Surgical History:  Procedure Laterality Date   ABDOMINAL HYSTERECTOMY     BREAST EXCISIONAL BIOPSY Right    CATARACT EXTRACTION W/PHACO Right 02/05/2017   Procedure: CATARACT EXTRACTION PHACO AND INTRAOCULAR LENS PLACEMENT RIGHT EYE;  Surgeon: Gemma Payor, MD;  Location: [facility];  Service: Ophthalmology;  Laterality: Right;  CDE:  6.01   CATARACT EXTRACTION W/PHACO Left 01/15/2017   Procedure: CATARACT EXTRACTION PHACO AND INTRAOCULAR LENS PLACEMENT (IOC);  Surgeon: Gemma Payor, MD;  Location: [facility];  Service: Ophthalmology;  Laterality: Left;  cde-7.04   JOINT REPLACEMENT     bilateral knees adn right hip   REPLACEMENT TOTAL KNEE BILATERAL     TOTAL HIP ARTHROPLASTY Right    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   Procedure: TOTAL HIP ARTHROPLASTY ANTERIOR APPROACH;  Surgeon: Sheral Apley, MD;  Location: [facility];  Service: Orthopedics;  Laterality: Left;     OB History   No obstetric history on file.      Home Medications    Prior to Admission medications   Medication Sig Start Date End Date Taking? Authorizing Provider  azithromycin (ZITHROMAX) 250 MG tablet Take first 2 tablets together, then 1 every day until finished. 12/08/23  Yes Particia Nearing, PA-C  promethazine-dextromethorphan (PROMETHAZINE-DM) 6.25-15 MG/5ML syrup Take 5 mLs by mouth 4 (four) times daily as needed. 12/08/23  Yes Particia Nearing, PA-C  acetaminophen (TYLENOL) 500 MG tablet Take 1,000 mg by mouth every 6 (six) hours.    [provider]  Ascorbic Acid (VITAMIN C) 500 MG CHEW Chew 1,000 mg by mouth daily.    [provider]  Calcium Carbonate (CALTRATE 600 PO) Take 600 mg by mouth daily at 12 noon. Chew    [provider]  Carboxymethylcellulose Sodium (THERATEARS) 0.25 % SOLN Place 1 drop into both eyes in the morning, at noon, and at bedtime.    [provider]  cholecalciferol (VITAMIN D3) 25 MCG (1000 UNIT) tablet Take 1,000 Units by mouth daily.    [provider]  clindamycin (CLEOCIN) 300 MG capsule TAKE TWO CAPSULES BY MOUTH 30 MINUTES PRIOR TO DENTAL PROCEDURE 11/27/22   Vickki Hearing, MD  DHA-EPA-Flaxseed Oil-Vitamin E [facility] NUTRITION) CAPS Take 3 capsules by mouth daily.    [provider]  hydrochlorothiazide (HYDRODIURIL) 25 MG tablet Take 25 mg by mouth in the morning and at  bedtime.    [provider]  omeprazole (PRILOSEC) 20 MG capsule Take 1 capsule (20 mg total) by mouth daily. 01/11/21 01/11/22  Rainey Pines, PA-C  Pediatric Multivitamins-Iron (FLINTSTONES PLUS IRON PO) Take 1 tablet by mouth daily.    [provider]  pyridOXINE (VITAMIN B-6) 100 MG tablet Take 200 mg by mouth daily.    [provider]  Vibegron (GEMTESA) 75 MG TABS Take 1 tablet (75 mg total) by mouth daily. 09/28/23   Donnita Falls, FNP  vitamin B-12 (CYANOCOBALAMIN)  1000 MCG tablet Take 1,000 mcg by mouth daily.    [provider]    Family History History reviewed. No pertinent family history.  Social History Social History   Tobacco Use   Smoking status: Never   Smokeless tobacco: Never  Vaping Use   Vaping status: Never Used  Substance Use Topics   Alcohol use: No   Drug use: No     Allergies   Codeine and Penicillins   Review of Systems Review of Systems Per HPI  Physical Exam Triage Vital Signs ED Triage Vitals [12/08/23 0959]  Encounter Vitals Group     BP 130/82     Systolic BP Percentile      Diastolic BP Percentile      Pulse Rate 68     Resp 18     Temp 97.9 F (36.6 C)     Temp Source Oral     SpO2 96 %     Weight      Height      Head Circumference      Peak Flow      Pain Score 0     Pain Loc      Pain Education      Exclude from Growth Chart    No data found.  Updated Vital Signs BP 130/82 (BP Location: Right Arm)   Pulse 68   Temp 97.9 F (36.6 C) (Oral)   Resp 18   SpO2 96%   Visual Acuity Right Eye Distance:   Left Eye Distance:   Bilateral Distance:    Right Eye Near:   Left Eye Near:    Bilateral Near:     Physical Exam Vitals and nursing note reviewed.  Constitutional:      Appearance: Normal appearance.  HENT:     Head: Atraumatic.     Right Ear: Tympanic membrane and external ear normal.     Left Ear: Tympanic membrane and external ear normal.     Nose: Congestion present.     Mouth/Throat:     Mouth: Mucous membranes are moist.     Pharynx: Posterior oropharyngeal erythema present.  Eyes:     Extraocular Movements: Extraocular movements intact.     Conjunctiva/sclera: Conjunctivae normal.  Cardiovascular:     Rate and Rhythm: Normal rate and regular rhythm.     Heart sounds: Normal heart sounds.  Pulmonary:     Effort: Pulmonary effort is normal.     Breath sounds: Wheezing and rales present.  Musculoskeletal:        General: Normal range of motion.      Cervical back: Normal range of motion and neck supple.  Skin:    General: Skin is warm and dry.  Neurological:     Mental Status: She is alert and oriented to person, place, and time.  Psychiatric:        Mood and Affect: Mood normal.  Thought Content: Thought content normal.      UC Treatments / Results  Labs (all labs ordered are listed, but only abnormal results are displayed) Labs Reviewed - No data to display  EKG   Radiology No results found.  Procedures Procedures (including critical care time)  Medications Ordered in UC Medications - No data to display  Initial Impression / Assessment and Plan / UC Course  I have reviewed the triage vital signs and the nursing notes.  Pertinent labs & imaging results that were available during my care of the patient were reviewed by me and considered in my medical decision making (see chart for details).     Given duration worsening course, treat with Zithromax, Phenergan DM, supportive over-the-counter medications and home care.  Return for worsening symptoms.  Final Clinical Impressions(s) / UC Diagnoses   Final diagnoses:  Lower respiratory infection   Discharge Instructions   None    ED Prescriptions     Medication Sig Dispense Auth. Provider   azithromycin (ZITHROMAX) 250 MG tablet Take first 2 tablets together, then 1 every day until finished. 6 tablet Particia Nearing, [place]   promethazine-dextromethorphan (PROMETHAZINE-DM) 6.25-15 MG/5ML syrup Take 5 mLs by mouth 4 (four) times daily as needed. 100 mL Particia Nearing, [place]      PDMP not reviewed this encounter.   Particia Nearing, [place] 12/08/23 1441

## 2023-12-08 NOTE — ED Triage Notes (Signed)
Pt reports she has had a cough that makes he r ribs sore and a headache x 2 weeks.

## 2023-12-11 NOTE — Progress Notes (Deleted)
Name: Brenda [name] DOB: 08-Sep-1946 MRN: [id number]  History of Present Illness: Brenda [name] is a 77 y.o. female who presents today for follow up visit at [facility] [place].  ***She is accompanied by ***. - GU history: 1. OAB with urinary frequency, nocturia, urgency, and urge incontinence. - Denies caffeine intake. - Previously tried Oxybutynin 5 mg 2x/day; discontinued due to dry mouth and inefficacy.  2. Stress urinary incontinence. Urge incontinence is predominant.   At initial visit on 09/28/2023: Started Gemtesa 75 mg daily.  Since last visit: ***  Today: She {Actions; denies-reports:120008} symptomatic improvement since starting Gemtesa (Vibegron) 75 mg daily.  She reports {Blank multiple:19197::"improved","persistent / unchanged"} urinary ***frequency, ***nocturia, ***urgency, and ***urge incontinence. Voiding ***x/day and ***x/night on average. Leaking ***x/day on average; using *** ***pads / ***diapers per day on average.  She {Actions; denies-reports:120008} dysuria, gross hematuria, straining to void, or sensations of incomplete emptying.   Fall Screening: Do you usually have a device to assist in your mobility? {yes/no:20286} ***cane / ***walker / ***wheelchair   Medications: Current Outpatient Medications  Medication Sig Dispense Refill   acetaminophen (TYLENOL) 500 MG tablet Take 1,000 mg by mouth every 6 (six) hours.     Ascorbic Acid (VITAMIN C) 500 MG CHEW Chew 1,000 mg by mouth daily.     azithromycin (ZITHROMAX) 250 MG tablet Take first 2 tablets together, then 1 every day until finished. 6 tablet 0   Calcium Carbonate (CALTRATE 600 PO) Take 600 mg by mouth daily at 12 noon. Chew     Carboxymethylcellulose Sodium (THERATEARS) 0.25 % SOLN Place 1 drop into both eyes in the morning, at noon, and at bedtime.     cholecalciferol (VITAMIN D3) 25 MCG (1000 UNIT) tablet Take 1,000 Units by mouth daily.     clindamycin (CLEOCIN) 300 MG capsule TAKE  TWO CAPSULES BY MOUTH 30 MINUTES PRIOR TO DENTAL PROCEDURE 2 capsule 5   DHA-EPA-Flaxseed Oil-Vitamin E (THERATEARS NUTRITION) CAPS Take 3 capsules by mouth daily.     hydrochlorothiazide (HYDRODIURIL) 25 MG tablet Take 25 mg by mouth in the morning and at bedtime.     omeprazole (PRILOSEC) 20 MG capsule Take 1 capsule (20 mg total) by mouth daily. 30 capsule 1   Pediatric Multivitamins-Iron (FLINTSTONES PLUS IRON PO) Take 1 tablet by mouth daily.     promethazine-dextromethorphan (PROMETHAZINE-DM) 6.25-15 MG/5ML syrup Take 5 mLs by mouth 4 (four) times daily as needed. 100 mL 0   pyridOXINE (VITAMIN B-6) 100 MG tablet Take 200 mg by mouth daily.     Vibegron (GEMTESA) 75 MG TABS Take 1 tablet (75 mg total) by mouth daily. 30 tablet 11   vitamin B-12 (CYANOCOBALAMIN) 1000 MCG tablet Take 1,000 mcg by mouth daily.     No current facility-administered medications for this visit.    Allergies: Allergies  Allergen Reactions   Codeine Rash   Penicillins Rash    Has patient had a PCN reaction causing immediate rash, facial/tongue/throat swelling, SOB or lightheadedness with hypotension:unknown Has patient had a PCN reaction causing severe rash involving mucus membranes or skin necrosis:No Has patient had a PCN reaction that required hospitalization No Has patient had a PCN reaction occurring within the last 10 years: No If all of the above answers are "NO", then may proceed with Cephalosporin use.     Past Medical History:  Diagnosis Date   Arthritis    Family history of adverse reaction to anesthesia    sister- N/V    Heart  murmur    Hypertension    PONV (postoperative nausea and vomiting)    Past Surgical History:  Procedure Laterality Date   ABDOMINAL HYSTERECTOMY     BREAST EXCISIONAL BIOPSY Right    CATARACT EXTRACTION W/PHACO Right 02/05/2017   Procedure: CATARACT EXTRACTION PHACO AND INTRAOCULAR LENS PLACEMENT RIGHT EYE;  Surgeon: Gemma Payor, MD;  Location: [facility];  Service:  Ophthalmology;  Laterality: Right;  CDE:  6.01   CATARACT EXTRACTION W/PHACO Left 01/15/2017   Procedure: CATARACT EXTRACTION PHACO AND INTRAOCULAR LENS PLACEMENT (IOC);  Surgeon: Gemma Payor, MD;  Location: [facility];  Service: Ophthalmology;  Laterality: Left;  cde-7.04   JOINT REPLACEMENT     bilateral knees adn right hip   REPLACEMENT TOTAL KNEE BILATERAL     TOTAL HIP ARTHROPLASTY Right    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   Procedure: TOTAL HIP ARTHROPLASTY ANTERIOR APPROACH;  Surgeon: Sheral Apley, MD;  Location: [facility];  Service: Orthopedics;  Laterality: Left;   No family history on file. Social History   Socioeconomic History   Marital status: Divorced    Spouse name: Not on file   Number of children: Not on file   Years of education: Not on file   Highest education level: Not on file  Occupational History   Not on file  Tobacco Use   Smoking status: Never   Smokeless tobacco: Never  Vaping Use   Vaping status: Never Used  Substance and Sexual Activity   Alcohol use: No   Drug use: No   Sexual activity: Not Currently    Birth control/protection: Surgical  Other Topics Concern   Not on file  Social History Narrative   Not on file   Social Drivers of Health   Financial Resource Strain: Not on file  Food Insecurity: Not on file  Transportation Needs: Not on file  Physical Activity: Not on file  Stress: Not on file  Social Connections: Not on file  Intimate Partner Violence: Not on file    Review of Systems Constitutional: Patient denies any unintentional weight loss or change in strength lntegumentary: Patient denies any rashes or pruritus Eyes: Patient {Actions; denies-reports:120008} dry eyes ENT: Patient {Actions; denies-reports:120008} dry mouth Cardiovascular: Patient denies chest pain or syncope Respiratory: Patient denies shortness of breath Gastrointestinal: Patient ***denies nausea, vomiting, constipation, or diarrhea Musculoskeletal: Patient  denies muscle cramps or weakness Neurologic: Patient denies convulsions or seizures Allergic/Immunologic: Patient denies recent allergic reaction(s) Hematologic/Lymphatic: Patient denies bleeding tendencies Endocrine: Patient denies heat/cold intolerance  GU: As per HPI.  OBJECTIVE There were no vitals filed for this visit. There is no height or weight on file to calculate BMI.  Physical Examination Constitutional: No obvious distress; patient is non-toxic appearing  Cardiovascular: No visible lower extremity edema.  Respiratory: The patient does not have audible wheezing/stridor; respirations do not appear labored  Gastrointestinal: Abdomen non-distended Musculoskeletal: Normal ROM of UEs  Skin: No obvious rashes/open sores  Neurologic: CN 2-12 grossly intact Psychiatric: Answered questions appropriately with normal affect  Hematologic/Lymphatic/Immunologic: No obvious bruises or sites of spontaneous bleeding  Urine microscopy: ***negative *** WBC/hpf, *** RBC/hpf, *** bacteria UA: ***negative *** WBC/hpf, *** RBC/hpf, *** bacteria ***with no evidence of UTI ***with no evidence of microscopic hematuria ***otherwise unremarkable  PVR: *** ml  ASSESSMENT No diagnosis found. *** We discussed the symptoms of overactive bladder (OAB), which include urinary urgency, frequency, nocturia, with or without urge incontinence.   While we may not know the exact etiology of OAB, several  risk factors can be identified.  - We discussed this patient's neurogenic risk factors for OAB-type symptoms including ***T2DM, ***nicotine use, ***.  - Likely exacerbated by ***diuretic use, ***caffeine intake, ***consumption of bladder irritants such as (acidic foods, spicy foods, alcohol).   We discussed the following management options in detail including potential benefits, risks, and side effects: Behavioral therapy: Modify fluid intake Decreasing bladder irritants (such as caffeine, acidic foods,  spicy foods, alcohol) Urge suppression strategies Bladder retraining / timed voiding Double voiding Medication(s): - For anticholinergic medications, we discussed the potential side effects of anticholinergics including dry eyes, dry mouth, constipation, cognitive impairment and urinary retention.  - For beta-3 agonist medication, we discussed the risk for urinary retention and the potential side effect of elevated blood pressure specific to Myrbetriq (which is more likely to occur in individuals with uncontrolled hypertension).  For refractory cases: PTNS (posterior tibial nerve stimulation) Sacral neuromodulation trial (Medtronic lnterStim or Axonics implant) Bladder Botox injections In extreme cases, SP tube placement  ***In order to further evaluate urinary incontinence, She was instructed to complete a 3-day bladder diary.  ***Discussed recommendation for urodynamic testing, which was described in detail including risks such as bleeding, infection, organ/tissue/nerve damage.  She decided to proceed with *** ***work on behavioral modifications including ***minimizing caffeine intake and working on ***timed voiding.  Will plan for follow up in ***8 weeks / *** months / ***1 year or sooner if needed. Pt verbalized understanding and agreement. All questions were answered.  PLAN Advised the following: *** ***. Minimize caffeine intake. ***. Work on timed voiding. ***. Urge suppression strategies. ***. Double/ triple voiding. ***. No follow-ups on file.  No orders of the defined types were placed in this encounter.   It has been explained that the patient is to follow regularly with their PCP in addition to all other providers involved in their care and to follow instructions provided by these respective offices. Patient advised to contact urology clinic if any urologic-pertaining questions, concerns, new symptoms or problems arise in the interim period.  There are no Patient  Instructions on file for this visit.  Electronically signed by:  Donnita Falls, FNP   12/11/23    4:52 PM

## 2023-12-14 ENCOUNTER — Ambulatory Visit: Payer: 59 | Admitting: Urology

## 2023-12-14 DIAGNOSIS — N3281 Overactive bladder: Secondary | ICD-10-CM

## 2023-12-14 DIAGNOSIS — N393 Stress incontinence (female) (male): Secondary | ICD-10-CM

## 2023-12-14 DIAGNOSIS — N3941 Urge incontinence: Secondary | ICD-10-CM

## 2024-01-04 DIAGNOSIS — M1612 Unilateral primary osteoarthritis, left hip: Secondary | ICD-10-CM | POA: Diagnosis not present

## 2024-01-08 NOTE — Progress Notes (Signed)
Name: Brenda [name] DOB: 01/12/1946 MRN: [id number]  History of Present Illness: [name] is a 78 y.o. female who presents today for follow up visit at [facility] [place].  - GU history: 1. OAB with urinary frequency, nocturia, urgency, and urge incontinence. - Denies caffeine intake. - Previously tried Oxybutynin 5 mg 2x/day; discontinued due to dry mouth and inefficacy.  2. Stress urinary incontinence. Urge incontinence is predominant.   At initial visit on 09/28/2023: - For OAB with UUI: Started Gemtesa 75 mg daily. - For SUI: She elected to proceed with expectant management.  Today: She denies symptomatic improvement with Gemtesa (Vibegron) 75 mg daily so she discontinued that after 6 weeks. She started taking an OTC supplement about 2 weeks ago which seems to be helping somewhat (AZO Bladder Control). Currently she reports improved awareness of when it's time to void and decreased urinary urgency, frequency, nocturia, and no recent episodes of urge incontinence.   She denies dysuria, gross hematuria, straining to void, or sensations of incomplete emptying.   Fall Screening: Do you usually have a device to assist in your mobility? No   Medications: Current Outpatient Medications  Medication Sig Dispense Refill   acetaminophen (TYLENOL) 500 MG tablet Take 1,000 mg by mouth every 6 (six) hours.     Ascorbic Acid (VITAMIN C) 500 MG CHEW Chew 1,000 mg by mouth daily.     Calcium Carbonate (CALTRATE 600 PO) Take 600 mg by mouth daily at 12 noon. Chew     Carboxymethylcellulose Sodium (THERATEARS) 0.25 % SOLN Place 1 drop into both eyes in the morning, at noon, and at bedtime.     cholecalciferol (VITAMIN D3) 25 MCG (1000 UNIT) tablet Take 1,000 Units by mouth daily.     clindamycin (CLEOCIN) 300 MG capsule TAKE TWO CAPSULES BY MOUTH 30 MINUTES PRIOR TO DENTAL PROCEDURE 2 capsule 5   DHA-EPA-Flaxseed Oil-Vitamin E (THERATEARS NUTRITION) CAPS Take 3 capsules by mouth  daily.     hydrochlorothiazide (HYDRODIURIL) 25 MG tablet Take 25 mg by mouth in the morning and at bedtime.     Pediatric Multivitamins-Iron (FLINTSTONES PLUS IRON PO) Take 1 tablet by mouth daily.     Pumpkin Seed (AZO MEN BLADDER CONTROL) 500 MG CAPS Take 500 mg by mouth daily.     pyridOXINE (VITAMIN B-6) 100 MG tablet Take 200 mg by mouth daily.     vitamin B-12 (CYANOCOBALAMIN) 1000 MCG tablet Take 1,000 mcg by mouth daily.     promethazine-dextromethorphan (PROMETHAZINE-DM) 6.25-15 MG/5ML syrup Take 5 mLs by mouth 4 (four) times daily as needed. (Patient not taking: Reported on 01/18/2024) 100 mL 0   No current facility-administered medications for this visit.    Allergies: Allergies  Allergen Reactions   Codeine Rash   Penicillins Rash    Has patient had a PCN reaction causing immediate rash, facial/tongue/throat swelling, SOB or lightheadedness with hypotension:unknown Has patient had a PCN reaction causing severe rash involving mucus membranes or skin necrosis:No Has patient had a PCN reaction that required hospitalization No Has patient had a PCN reaction occurring within the last 10 years: No If all of the above answers are "NO", then may proceed with Cephalosporin use.     Past Medical History:  Diagnosis Date   Arthritis    Family history of adverse reaction to anesthesia    sister- N/V    Heart murmur    Hypertension    PONV (postoperative nausea and vomiting)    Past Surgical History:  Procedure Laterality Date   ABDOMINAL HYSTERECTOMY     BREAST EXCISIONAL BIOPSY Right    CATARACT EXTRACTION W/PHACO Right 02/05/2017   Procedure: CATARACT EXTRACTION PHACO AND INTRAOCULAR LENS PLACEMENT RIGHT EYE;  Surgeon: Gemma Payor, MD;  Location: [facility];  Service: Ophthalmology;  Laterality: Right;  CDE:  6.01   CATARACT EXTRACTION W/PHACO Left 01/15/2017   Procedure: CATARACT EXTRACTION PHACO AND INTRAOCULAR LENS PLACEMENT (IOC);  Surgeon: Gemma Payor, MD;  Location: [facility];   Service: Ophthalmology;  Laterality: Left;  cde-7.04   JOINT REPLACEMENT     bilateral knees adn right hip   REPLACEMENT TOTAL KNEE BILATERAL     TOTAL HIP ARTHROPLASTY Right    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   Procedure: TOTAL HIP ARTHROPLASTY ANTERIOR APPROACH;  Surgeon: Sheral Apley, MD;  Location: [facility];  Service: Orthopedics;  Laterality: Left;   History reviewed. No pertinent family history. Social History   Socioeconomic History   Marital status: Divorced    Spouse name: Not on file   Number of children: Not on file   Years of education: Not on file   Highest education level: Not on file  Occupational History   Not on file  Tobacco Use   Smoking status: Never   Smokeless tobacco: Never  Vaping Use   Vaping status: Never Used  Substance and Sexual Activity   Alcohol use: No   Drug use: No   Sexual activity: Not Currently    Birth control/protection: Surgical  Other Topics Concern   Not on file  Social History Narrative   Not on file   Social Drivers of Health   Financial Resource Strain: Not on file  Food Insecurity: Not on file  Transportation Needs: Not on file  Physical Activity: Not on file  Stress: Not on file  Social Connections: Not on file  Intimate Partner Violence: Not on file    Review of Systems Constitutional: Patient denies any unintentional weight loss or change in strength lntegumentary: Patient denies any rashes or pruritus Cardiovascular: Patient denies chest pain or syncope Respiratory: Patient denies shortness of breath Gastrointestinal: Patient denies nausea, vomiting, constipation, or diarrhea Musculoskeletal: Patient denies muscle cramps or weakness Neurologic: Patient denies convulsions or seizures Allergic/Immunologic: Patient denies recent allergic reaction(s) Hematologic/Lymphatic: Patient denies bleeding tendencies Endocrine: Patient denies heat/cold intolerance  GU: As per HPI.  OBJECTIVE Vitals:   01/18/24  1219  BP: 133/82  Pulse: 69  Temp: 97.7 F (36.5 C)   There is no height or weight on file to calculate BMI.  Physical Examination Constitutional: No obvious distress; patient is non-toxic appearing  Cardiovascular: No visible lower extremity edema.  Respiratory: The patient does not have audible wheezing/stridor; respirations do not appear labored  Gastrointestinal: Abdomen non-distended Musculoskeletal: Normal ROM of UEs  Skin: No obvious rashes/open sores  Neurologic: CN 2-12 grossly intact Psychiatric: Answered questions appropriately with normal affect  Hematologic/Lymphatic/Immunologic: No obvious bruises or sites of spontaneous bleeding  UA: Patient did not void in office today   ASSESSMENT OAB (overactive bladder) - Plan: Urinalysis, Routine w reflex microscopic, BLADDER SCAN AMB NON-IMAGING  Urge incontinence - Plan: Urinalysis, Routine w reflex microscopic, BLADDER SCAN AMB NON-IMAGING  Stress incontinence, female - Plan: Urinalysis, Routine w reflex microscopic, BLADDER SCAN AMB NON-IMAGING  She reports that her symptoms are manageable at this time on current supplement. OK to continue that. Reviewed concepts of timed voiding and avoiding bladder irritants (such as caffeine).   Will plan for follow up in  6 months or sooner if needed. Pt verbalized understanding and agreement. All questions were answered.   PLAN Advised the following: 1. Can continue AZO Bladder Control supplement. 2. Continue with minimal caffeine intake. 3. Work on timed voiding. 4. Return in about 6 months (around 07/17/2024) for UA, PVR, & f/u with Evette Georges NP.  Orders Placed This Encounter  Procedures   Urinalysis, Routine w reflex microscopic   BLADDER SCAN AMB NON-IMAGING   It has been explained that the patient is to follow regularly with their PCP in addition to all other providers involved in their care and to follow instructions provided by these respective offices. Patient  advised to contact urology clinic if any urologic-pertaining questions, concerns, new symptoms or problems arise in the interim period.  There are no Patient Instructions on file for this visit.  Electronically signed by:  Donnita Falls, FNP   01/18/24    2:04 PM

## 2024-01-18 ENCOUNTER — Ambulatory Visit (INDEPENDENT_AMBULATORY_CARE_PROVIDER_SITE_OTHER): Payer: 59 | Admitting: Urology

## 2024-01-18 ENCOUNTER — Encounter: Payer: Self-pay | Admitting: Urology

## 2024-01-18 VITALS — BP 133/82 | HR 69 | Temp 97.7°F

## 2024-01-18 DIAGNOSIS — N3941 Urge incontinence: Secondary | ICD-10-CM

## 2024-01-18 DIAGNOSIS — N393 Stress incontinence (female) (male): Secondary | ICD-10-CM

## 2024-01-18 DIAGNOSIS — N3281 Overactive bladder: Secondary | ICD-10-CM

## 2024-01-18 DIAGNOSIS — N3946 Mixed incontinence: Secondary | ICD-10-CM

## 2024-01-18 LAB — BLADDER SCAN AMB NON-IMAGING: Scan Result: 174

## 2024-02-29 ENCOUNTER — Other Ambulatory Visit: Payer: Self-pay | Admitting: Orthopedic Surgery

## 2024-02-29 DIAGNOSIS — Z98818 Other dental procedure status: Secondary | ICD-10-CM

## 2024-03-14 DIAGNOSIS — Z6823 Body mass index (BMI) 23.0-23.9, adult: Secondary | ICD-10-CM | POA: Diagnosis not present

## 2024-03-14 DIAGNOSIS — H8110 Benign paroxysmal vertigo, unspecified ear: Secondary | ICD-10-CM | POA: Diagnosis not present

## 2024-04-03 ENCOUNTER — Ambulatory Visit: Admitting: Orthopedic Surgery

## 2024-04-03 ENCOUNTER — Encounter: Payer: Self-pay | Admitting: Orthopedic Surgery

## 2024-04-03 DIAGNOSIS — M65332 Trigger finger, left middle finger: Secondary | ICD-10-CM

## 2024-04-03 MED ORDER — METHYLPREDNISOLONE ACETATE 40 MG/ML IJ SUSP
40.0000 mg | Freq: Once | INTRAMUSCULAR | Status: AC
  Administered 2024-04-03: 40 mg via INTRA_ARTICULAR

## 2024-04-03 NOTE — Progress Notes (Addendum)
 Chief Complaint  Patient presents with   Hand Pain    Finger pain and swelling middle finger left    History of 78 year old female presents with pain over the volar aspect of the left long or middle finger  We have not previously seen her for this digit and this will require evaluation and management prior to deciding treatment  She has a history of trigger finger right long and left ring.  She has had pain over the A1 pulley for several weeks now  Allergies  Allergen Reactions   Codeine Rash   Penicillins Rash    Has patient had a PCN reaction causing immediate rash, facial/tongue/throat swelling, SOB or lightheadedness with hypotension:unknown Has patient had a PCN reaction causing severe rash involving mucus membranes or skin necrosis:No Has patient had a PCN reaction that required hospitalization No Has patient had a PCN reaction occurring within the last 10 years: No If all of the above answers are "NO", then may proceed with Cephalosporin use.    Past Medical History:  Diagnosis Date   Arthritis    Family history of adverse reaction to anesthesia    sister- N/V    Heart murmur    Hypertension    PONV (postoperative nausea and vomiting)     Exam of the left hand shows that the fingers show some nodularity which are consistent with osteoarthritis of the IP joint she has pain over the volar aspect of the A1 pulley of the long finger she can bend and straighten it no catching or locking today  Neurovascular exam is intact  Recommend injection  Trigger finger injection  Diagnosis   Encounter Diagnosis  Name Primary?   Trigger finger, left middle finger Yes    Procedure injection A1 pulley Medications lidocaine 1% 1 mL and Depo-Medrol 40 mg 1 mL Skin prep alcohol and ethyl chloride Verbal consent was obtained Timeout confirmed the injection site  After cleaning the skin with alcohol and anesthetizing the skin with ethyl chloride the A1 pulley was palpated and the  injection was performed without complication   Return as needed

## 2024-07-25 DEATH — deceased

## 2024-09-05 ENCOUNTER — Encounter: Payer: Self-pay | Admitting: Physician Assistant

## 2024-09-05 ENCOUNTER — Ambulatory Visit: Admitting: Physician Assistant

## 2024-09-05 VITALS — BP 134/75 | HR 65 | Temp 97.2°F | Ht 65.0 in | Wt 137.0 lb

## 2024-09-05 DIAGNOSIS — I10 Essential (primary) hypertension: Secondary | ICD-10-CM

## 2024-09-05 DIAGNOSIS — Z23 Encounter for immunization: Secondary | ICD-10-CM | POA: Diagnosis not present

## 2024-09-05 DIAGNOSIS — Z7689 Persons encountering health services in other specified circumstances: Secondary | ICD-10-CM

## 2024-09-05 DIAGNOSIS — E559 Vitamin D deficiency, unspecified: Secondary | ICD-10-CM | POA: Insufficient documentation

## 2024-09-05 DIAGNOSIS — E782 Mixed hyperlipidemia: Secondary | ICD-10-CM

## 2024-09-05 DIAGNOSIS — K5904 Chronic idiopathic constipation: Secondary | ICD-10-CM | POA: Diagnosis not present

## 2024-09-05 DIAGNOSIS — E785 Hyperlipidemia, unspecified: Secondary | ICD-10-CM | POA: Insufficient documentation

## 2024-09-05 DIAGNOSIS — E039 Hypothyroidism, unspecified: Secondary | ICD-10-CM | POA: Diagnosis not present

## 2024-09-05 DIAGNOSIS — H8110 Benign paroxysmal vertigo, unspecified ear: Secondary | ICD-10-CM | POA: Insufficient documentation

## 2024-09-05 DIAGNOSIS — M810 Age-related osteoporosis without current pathological fracture: Secondary | ICD-10-CM | POA: Insufficient documentation

## 2024-09-05 NOTE — Progress Notes (Signed)
 New Patient Office Visit  Subjective    Patient ID: Brenda [name], female    DOB: [birth year]  Age: 78 y.o. MRN: [id number]  CC: No chief complaint on file.   HPI Brenda [name] presents to establish care  Discussed the use of AI scribe software for clinical note transcription with the patient, who gave verbal consent to proceed.  History of Present Illness Brenda [name] is a 78 year old female who presents with constipation.  She has experienced constipation for the past couple of months. Over-the-counter stool softeners, magnesium supplements, and increased water  intake have not provided relief. Dulcolax is effective at four pills, though she has only used it once. She experiences bloating but no nausea, vomiting, or blood in her stool. She has small daily bowel movements without relief.  Her diet includes plenty of water , a protein drink in the morning, and plenty of vegetables. She is a lifetime member of [employer]. Socially, she is active, caring for her two-year-old great-niece three days a week and occasionally looking after other grandchildren. She aims to walk 7,500 steps a day and helps with household chores for her family. She feels well overall but sometimes falls asleep in the evening after a busy day. She is due for her Medicare AWV. She does not have additional concerns or complaints today.     Outpatient Encounter Medications as of 09/05/2024  Medication Sig   Ascorbic Acid (VITAMIN C) 500 MG CHEW Chew 1,000 mg by mouth daily.   Calcium Carbonate (CALTRATE 600 PO) Take 600 mg by mouth daily at 12 noon. Chew   Carboxymethylcellulose Sodium (THERATEARS) 0.25 % SOLN Place 1 drop into both eyes in the morning, at noon, and at bedtime.   cholecalciferol (VITAMIN D3) 25 MCG (1000 UNIT) tablet Take 1,000 Units by mouth daily.   clindamycin  (CLEOCIN ) 300 MG capsule TAKE TWO CAPSULES BY MOUTH 30 MINUTES PRIOR TO DENTAL PROCEDURE   hydrochlorothiazide (HYDRODIURIL) 25 MG  tablet Take 25 mg by mouth in the morning and at bedtime.   meclizine (ANTIVERT) 12.5 MG tablet Take 12.5 mg by mouth 3 (three) times daily as needed for dizziness.   OVER THE COUNTER MEDICATION K2 d3   Pediatric Multivitamins-Iron (FLINTSTONES PLUS IRON PO) Take 1 tablet by mouth daily.   pyridOXINE (VITAMIN B-6) 100 MG tablet Take 200 mg by mouth daily.   vitamin B-12 (CYANOCOBALAMIN ) 1000 MCG tablet Take 1,000 mcg by mouth daily.   [DISCONTINUED] acetaminophen  (TYLENOL ) 500 MG tablet Take 1,000 mg by mouth every 6 (six) hours.   [DISCONTINUED] DHA-EPA-Flaxseed Oil-Vitamin E (THERATEARS NUTRITION) CAPS Take 3 capsules by mouth daily.   [DISCONTINUED] promethazine -dextromethorphan (PROMETHAZINE -DM) 6.25-15 MG/5ML syrup Take 5 mLs by mouth 4 (four) times daily as needed. (Patient not taking: Reported on 01/18/2024)   [DISCONTINUED] Pumpkin Seed (AZO MEN BLADDER CONTROL) 500 MG CAPS Take 500 mg by mouth daily.   No facility-administered encounter medications on file as of 09/05/2024.    Past Medical History:  Diagnosis Date   Arthritis    Family history of adverse reaction to anesthesia    sister- N/V    Heart murmur    Hypertension    PONV (postoperative nausea and vomiting)     Past Surgical History:  Procedure Laterality Date   ABDOMINAL HYSTERECTOMY     BREAST EXCISIONAL BIOPSY Right    CATARACT EXTRACTION W/PHACO Right 02/05/2017   Procedure: CATARACT EXTRACTION PHACO AND INTRAOCULAR LENS PLACEMENT RIGHT EYE;  Surgeon: Cherene Mania, MD;  Location: [facility];  Service: Ophthalmology;  Laterality: Right;  CDE:  6.01   CATARACT EXTRACTION W/PHACO Left 01/15/2017   Procedure: CATARACT EXTRACTION PHACO AND INTRAOCULAR LENS PLACEMENT (IOC);  Surgeon: Cherene Mania, MD;  Location: [facility];  Service: Ophthalmology;  Laterality: Left;  cde-7.04   JOINT REPLACEMENT     bilateral knees adn right hip   REPLACEMENT TOTAL KNEE BILATERAL     TOTAL HIP ARTHROPLASTY Right    TOTAL HIP ARTHROPLASTY Left  01/11/2021   Procedure: TOTAL HIP ARTHROPLASTY ANTERIOR APPROACH;  Surgeon: Beverley Evalene BIRCH, MD;  Location: [facility];  Service: Orthopedics;  Laterality: Left;    History reviewed. No pertinent family history.  Social History   Socioeconomic History   Marital status: Divorced    Spouse name: Not on file   Number of children: Not on file   Years of education: Not on file   Highest education level: 12th grade  Occupational History   Not on file  Tobacco Use   Smoking status: Never   Smokeless tobacco: Never  Vaping Use   Vaping status: Never Used  Substance and Sexual Activity   Alcohol use: No   Drug use: No   Sexual activity: Not Currently    Birth control/protection: Surgical  Other Topics Concern   Not on file  Social History Narrative   Not on file   Social Drivers of Health   Financial Resource Strain: Low Risk  (09/04/2024)   Overall Financial Resource Strain (CARDIA)    Difficulty of Paying Living Expenses: Not hard at all  Food Insecurity: No Food Insecurity (09/04/2024)   Hunger Vital Sign    Worried About Running Out of Food in the Last Year: Never true    Ran Out of Food in the Last Year: Never true  Transportation Needs: No Transportation Needs (09/04/2024)   PRAPARE - Administrator, Civil Service (Medical): No    Lack of Transportation (Non-Medical): No  Physical Activity: Sufficiently Active (09/04/2024)   Exercise Vital Sign    Days of Exercise per Week: 7 days    Minutes of Exercise per Session: 50 min  Stress: No Stress Concern Present (09/04/2024)   [employer] of Occupational Health - Occupational Stress Questionnaire    Feeling of Stress: Not at all  Social Connections: Moderately Integrated (09/04/2024)   Social Connection and Isolation Panel    Frequency of Communication with Friends and Family: Once a week    Frequency of Social Gatherings with Friends and Family: Three times a week    Attends Religious Services: More than 4  times per year    Active Member of Clubs or Organizations: Yes    Attends Engineer, structural: More than 4 times per year    Marital Status: Divorced  Catering manager Violence: Not on file    Review of Systems  Constitutional:  Negative for chills, fever and malaise/fatigue.  Eyes:  Negative for blurred vision and double vision.  Respiratory:  Negative for cough and shortness of breath.   Cardiovascular:  Negative for chest pain and palpitations.  Gastrointestinal:  Positive for constipation. Negative for abdominal pain, blood in stool, diarrhea, nausea and vomiting.  Neurological:  Negative for dizziness and headaches.        Objective    BP 134/75   Pulse 65   Temp (!) 97.2 F (36.2 C)   Ht 5' 5 (1.651 m)   Wt 137 lb (62.1 kg)   SpO2 97%  BMI 22.80 kg/m   Physical Exam Constitutional:      General: She is not in acute distress.    Appearance: Normal appearance. She is normal weight. She is not ill-appearing.  HENT:     Head: Normocephalic and atraumatic.     Mouth/Throat:     Mouth: Mucous membranes are moist.     Pharynx: Oropharynx is clear.  Eyes:     Extraocular Movements: Extraocular movements intact.     Conjunctiva/sclera: Conjunctivae normal.  Cardiovascular:     Rate and Rhythm: Normal rate and regular rhythm.     Heart sounds: Normal heart sounds. No murmur heard. Pulmonary:     Effort: Pulmonary effort is normal.     Breath sounds: Normal breath sounds. No wheezing or rales.  Abdominal:     General: Abdomen is flat. Bowel sounds are normal. There is no distension.     Tenderness: There is abdominal tenderness (generalized (mild)).  Musculoskeletal:     Right lower leg: No edema.     Left lower leg: No edema.  Skin:    General: Skin is warm and dry.  Neurological:     General: No focal deficit present.     Mental Status: She is alert and oriented to person, place, and time.  Psychiatric:        Mood and Affect: Mood normal.         Behavior: Behavior normal.       Assessment & Plan:  Encounter to establish care  Chronic idiopathic constipation Assessment & Plan: Chronic constipation with bloating, effective response to higher doses of Dulcolax. Adequate hydration and fiber intake noted. Possible age-related motility decrease. Overall benign physical exam, normal BS, no distension, mild generalized abdominal pain.  - Start Miralax daily, adjust based on stool consistency and frequency. - Combine Miralax with Colace for stool softening. - Continue dietary and hydration efforts.  - Follow up if symptoms do not improve with the addition of MiraLAX, consider prescription medications if ineffective. - Warning signs reviewed.    Primary hypertension Assessment & Plan: 134/75 Controlled. Continue current medications. No change in management. Discussed DASH diet and dietary sodium restrictions.  Continue dietary efforts and physical activity.   Orders: -     CMP14+EGFR -     CBC with Differential/Platelet  Moderate mixed hyperlipidemia not requiring statin therapy -     Lipid panel  Hypothyroidism, unspecified type -     TSH + free T4  Immunization due -     Flu vaccine HIGH DOSE PF(Fluzone Trivalent)    Return in about 1 year (around 09/05/2025), or sooner if constipation does not improve.   Charmaine Alexsis Branscom, PA-C

## 2024-09-05 NOTE — Assessment & Plan Note (Signed)
 Chronic constipation with bloating, effective response to higher doses of Dulcolax. Adequate hydration and fiber intake noted. Possible age-related motility decrease. Overall benign physical exam, normal BS, no distension, mild generalized abdominal pain.  - Start Miralax daily, adjust based on stool consistency and frequency. - Combine Miralax with Colace for stool softening. - Continue dietary and hydration efforts.  - Follow up if symptoms do not improve with the addition of MiraLAX, consider prescription medications if ineffective. - Warning signs reviewed.

## 2024-09-05 NOTE — Assessment & Plan Note (Signed)
 134/75 Controlled. Continue current medications. No change in management. Discussed DASH diet and dietary sodium restrictions.  Continue dietary efforts and physical activity.

## 2024-09-06 LAB — CMP14+EGFR
ALT: 21 IU/L (ref 0–32)
AST: 26 IU/L (ref 0–40)
Albumin: 4.5 g/dL (ref 3.8–4.8)
Alkaline Phosphatase: 100 IU/L (ref 44–121)
BUN/Creatinine Ratio: 35 — ABNORMAL HIGH (ref 12–28)
BUN: 22 mg/dL (ref 8–27)
Bilirubin Total: 0.6 mg/dL (ref 0.0–1.2)
CO2: 26 mmol/L (ref 20–29)
Calcium: 9.7 mg/dL (ref 8.7–10.3)
Chloride: 100 mmol/L (ref 96–106)
Creatinine, Ser: 0.63 mg/dL (ref 0.57–1.00)
Globulin, Total: 2.6 g/dL (ref 1.5–4.5)
Glucose: 100 mg/dL — ABNORMAL HIGH (ref 70–99)
Potassium: 3.9 mmol/L (ref 3.5–5.2)
Sodium: 140 mmol/L (ref 134–144)
Total Protein: 7.1 g/dL (ref 6.0–8.5)
eGFR: 91 mL/min/1.73 (ref >59)

## 2024-09-06 LAB — CBC WITH DIFFERENTIAL/PLATELET
Basophils Absolute: 0.1 x10E3/uL (ref 0.0–0.2)
Basos: 1 %
EOS (ABSOLUTE): 0.1 x10E3/uL (ref 0.0–0.4)
Eos: 2 %
Hematocrit: 43.2 % (ref 34.0–46.6)
Hemoglobin: 13.5 g/dL (ref 11.1–15.9)
Immature Grans (Abs): 0 x10E3/uL (ref 0.0–0.1)
Immature Granulocytes: 0 %
Lymphocytes Absolute: 1.3 x10E3/uL (ref 0.7–3.1)
Lymphs: 23 %
MCH: 27.5 pg (ref 26.6–33.0)
MCHC: 31.3 g/dL — ABNORMAL LOW (ref 31.5–35.7)
MCV: 88 fL (ref 79–97)
Monocytes Absolute: 0.6 x10E3/uL (ref 0.1–0.9)
Monocytes: 10 %
Neutrophils Absolute: 3.6 x10E3/uL (ref 1.4–7.0)
Neutrophils: 64 %
Platelets: 256 x10E3/uL (ref 150–450)
RBC: 4.91 x10E6/uL (ref 3.77–5.28)
RDW: 14.6 % (ref 11.7–15.4)
WBC: 5.7 x10E3/uL (ref 3.4–10.8)

## 2024-09-06 LAB — LIPID PANEL
Chol/HDL Ratio: 2.6 ratio (ref 0.0–4.4)
Cholesterol, Total: 226 mg/dL — ABNORMAL HIGH (ref 100–199)
HDL: 88 mg/dL (ref >39)
LDL Chol Calc (NIH): 129 mg/dL — ABNORMAL HIGH (ref 0–99)
Triglycerides: 50 mg/dL (ref 0–149)
VLDL Cholesterol Cal: 9 mg/dL (ref 5–40)

## 2024-09-06 LAB — TSH+FREE T4
Free T4: 1.14 ng/dL (ref 0.82–1.77)
TSH: 0.647 u[IU]/mL (ref 0.450–4.500)

## 2024-09-08 ENCOUNTER — Ambulatory Visit: Payer: Self-pay | Admitting: Physician Assistant

## 2024-10-09 ENCOUNTER — Ambulatory Visit (INDEPENDENT_AMBULATORY_CARE_PROVIDER_SITE_OTHER): Admitting: Orthopedic Surgery

## 2024-10-09 DIAGNOSIS — M65332 Trigger finger, left middle finger: Secondary | ICD-10-CM

## 2024-10-09 DIAGNOSIS — M65322 Trigger finger, left index finger: Secondary | ICD-10-CM

## 2024-10-09 DIAGNOSIS — M65342 Trigger finger, left ring finger: Secondary | ICD-10-CM | POA: Diagnosis not present

## 2024-10-09 MED ORDER — METHYLPREDNISOLONE ACETATE 40 MG/ML IJ SUSP
40.0000 mg | Freq: Once | INTRAMUSCULAR | Status: AC
  Administered 2024-10-09: 40 mg via INTRA_ARTICULAR

## 2024-10-09 NOTE — Addendum Note (Signed)
 Addended byBETHA JENEAN GREIG LELON on: 10/09/2024 05:03 PM   Modules accepted: Orders

## 2024-10-09 NOTE — Progress Notes (Signed)
 Chief Complaint  Patient presents with   Hand Problem    Left ring middle index fingers triggering /locking     The patient complains of triggering recurrent left ring finger left long finger new onset triggering left index finger  She exhibited full range of motion in the right hand decreased flexion in the involved digits of the left hand with tenderness over the A1 pulley of each digit Neurovascular exam is intact Interphalangeal joints various Heberden and Bouchard's nodes  3 injections  Trigger finger injection  Diagnosis left index finger tenosynovitis Procedure injection A1 pulley Medications lidocaine  1% 1 mL and Depo-Medrol  40 mg 1 mL Skin prep alcohol and ethyl chloride Verbal consent was obtained Timeout confirmed the injection site  After cleaning the skin with alcohol and anesthetizing the skin with ethyl chloride the A1 pulley was palpated and the injection was performed without complication  Trigger finger injection  Diagnosis left long finger tenosynovitis Procedure injection A1 pulley Medications lidocaine  1% 1 mL and Depo-Medrol  40 mg 1 mL Skin prep alcohol and ethyl chloride Verbal consent was obtained Timeout confirmed the injection site  After cleaning the skin with alcohol and anesthetizing the skin with ethyl chloride the A1 pulley was palpated and the injection was performed without complication  Trigger finger injection  Diagnosis left ring finger tenosynovitis Procedure injection A1 pulley Medications lidocaine  1% 1 mL and Depo-Medrol  40 mg 1 mL Skin prep alcohol and ethyl chloride Verbal consent was obtained Timeout confirmed the injection site  After cleaning the skin with alcohol and anesthetizing the skin with ethyl chloride the A1 pulley was palpated and the injection was performed without complication  Return if needed

## 2024-10-09 NOTE — Progress Notes (Signed)
    10/09/2024   Chief Complaint  Patient presents with   Hand Problem    Left ring middle index fingers triggering /locking     No diagnosis found.  What pharmacy do you use ? __[place] _________________________  DOI/DOS/ Date:    Worse  Last injection did help

## 2024-10-28 ENCOUNTER — Other Ambulatory Visit (HOSPITAL_COMMUNITY): Payer: Self-pay | Admitting: Physician Assistant

## 2024-10-28 DIAGNOSIS — Z1231 Encounter for screening mammogram for malignant neoplasm of breast: Secondary | ICD-10-CM

## 2024-11-04 ENCOUNTER — Other Ambulatory Visit: Payer: Self-pay | Admitting: Physician Assistant

## 2024-11-04 NOTE — Telephone Encounter (Unsigned)
 Copied from CRM [id number]. Topic: Clinical - Medication Refill >> Nov 04, 2024 10:26 AM Larissa S wrote: Medication: hydrochlorothiazide (HYDRODIURIL) 25 MG tablet  Has the patient contacted their pharmacy? Yes (Agent: If no, request that the patient contact the pharmacy for the refill. If patient does not wish to contact the pharmacy document the reason why and proceed with request.) (Agent: If yes, when and what did the pharmacy advise?)  This is the patient's preferred pharmacy:  [facility] - [place], [place] - [address] [place] [address] Phone: [phone] Fax: [phone]  Is this the correct pharmacy for this prescription? Yes If no, delete pharmacy and type the correct one.   Has the prescription been filled recently? No  Is the patient out of the medication? Yes  Has the patient been seen for an appointment in the last year OR does the patient have an upcoming appointment? Yes  Can we respond through MyChart? No  Agent: Please be advised that Rx refills may take up to 3 business days. We ask that you follow-up with your pharmacy.

## 2024-11-05 MED ORDER — HYDROCHLOROTHIAZIDE 25 MG PO TABS: 50.0000 mg | ORAL_TABLET | Freq: Every day | ORAL | 1 refills | Status: DC

## 2024-11-11 ENCOUNTER — Telehealth: Payer: Self-pay | Admitting: Physician Assistant

## 2024-11-11 NOTE — Telephone Encounter (Signed)
 Copied from CRM #[id number]. Topic: General - Other >> Nov 10, 2024  1:30 PM Zebedee SAUNDERS wrote: Reason for CRM: Pt stated that [place] G[facility] PA told pt that for pt's AWV, PA Grooms can schedule and take information from previous visit and submit it. Please call [phone] pt to schedule AWV since pt does not want to wait until 12/2024 for appt.

## 2024-11-14 ENCOUNTER — Ambulatory Visit (INDEPENDENT_AMBULATORY_CARE_PROVIDER_SITE_OTHER)

## 2024-11-14 VITALS — Ht 65.0 in | Wt 137.0 lb

## 2024-11-14 DIAGNOSIS — Z0000 Encounter for general adult medical examination without abnormal findings: Secondary | ICD-10-CM

## 2024-11-14 NOTE — Patient Instructions (Signed)
 [name],  Thank you for taking the time for your Medicare Wellness Visit. I appreciate your continued commitment to your health goals. Please review the care plan we discussed, and feel free to reach out if I can assist you further.  Please note that Annual Wellness Visits do not include a physical exam. Some assessments may be limited, especially if the visit was conducted virtually. If needed, we may recommend an in-person follow-up with your provider.  Ongoing Care Seeing your primary care provider every 3 to 6 months helps us  monitor your health and provide consistent, personalized care.   Referrals If a referral was made during today's visit and you haven't received any updates within two weeks, please contact the referred provider directly to check on the status.  Recommended Screenings:  Health Maintenance  Topic Date Due   Hepatitis C Screening  Never done   COVID-19 Vaccine (1 - 2025-26 season) Never done   Medicare Annual Wellness Visit  11/14/2025   DTaP/Tdap/Td vaccine (2 - Td or Tdap) 06/03/2030   Pneumococcal Vaccine for age over 99  Completed   Flu Shot  Completed   Osteoporosis screening with Bone Density Scan  Completed   Zoster (Shingles) Vaccine  Completed   Meningitis B Vaccine  Aged Out   Breast Cancer Screening  Discontinued       11/14/2024   10:05 AM  Advanced Directives  Does Patient Have a Medical Advance Directive? No  Would patient like information on creating a medical advance directive? Yes (MAU/Ambulatory/Procedural Areas - Information given)   Information on Advanced Care Planning can be found at [place]  Secretary of [facility] Advance Health Care Directives Advance Health Care Directives ([email or web address]    Vision: Annual vision screenings are recommended for early detection of glaucoma, cataracts, and diabetic retinopathy. These exams can also reveal signs of chronic conditions such as diabetes and high blood pressure.  Dental: Annual dental  screenings help detect early signs of oral cancer, gum disease, and other conditions linked to overall health, including heart disease and diabetes.  Please see the attached documents for additional preventive care recommendations.

## 2024-11-14 NOTE — Progress Notes (Signed)
 Chief Complaint  Patient presents with   Medicare Wellness     Subjective:   Brenda [name] is a 78 y.o. female who presents for a Medicare Annual Wellness Visit.  Allergies (verified) Codeine and Penicillins   History: Past Medical History:  Diagnosis Date   Allergy    Arthritis    Cataract    Cataract surgery B eyes   Family history of adverse reaction to anesthesia    sister- N/V    Heart murmur    Hypertension    PONV (postoperative nausea and vomiting)    Past Surgical History:  Procedure Laterality Date   ABDOMINAL HYSTERECTOMY     BREAST EXCISIONAL BIOPSY Right    CATARACT EXTRACTION W/PHACO Right 02/05/2017   Procedure: CATARACT EXTRACTION PHACO AND INTRAOCULAR LENS PLACEMENT RIGHT EYE;  Surgeon: Cherene Mania, MD;  Location: [facility];  Service: Ophthalmology;  Laterality: Right;  CDE:  6.01   CATARACT EXTRACTION W/PHACO Left 01/15/2017   Procedure: CATARACT EXTRACTION PHACO AND INTRAOCULAR LENS PLACEMENT (IOC);  Surgeon: Cherene Mania, MD;  Location: [facility];  Service: Ophthalmology;  Laterality: Left;  cde-7.04   CHOLECYSTECTOMY     EYE SURGERY     JOINT REPLACEMENT     bilateral knees adn right hip   REPLACEMENT TOTAL KNEE BILATERAL     TOTAL HIP ARTHROPLASTY Right    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   Procedure: TOTAL HIP ARTHROPLASTY ANTERIOR APPROACH;  Surgeon: Beverley Evalene BIRCH, MD;  Location: [facility];  Service: Orthopedics;  Laterality: Left;   Family History  Problem Relation Age of Onset   Heart disease Mother    Early death Brother    Social History   Occupational History   Not on file  Tobacco Use   Smoking status: Never   Smokeless tobacco: Never  Vaping Use   Vaping status: Never Used  Substance and Sexual Activity   Alcohol use: No   Drug use: No   Sexual activity: Not Currently    Birth control/protection: Surgical   Tobacco Counseling Counseling given: Not Answered  SDOH Screenings   Food Insecurity: No Food Insecurity  (11/14/2024)  Housing: Low Risk  (11/14/2024)  Transportation Needs: No Transportation Needs (11/14/2024)  Utilities: Not At Risk (11/14/2024)  Depression (PHQ2-9): Low Risk  (11/14/2024)  Financial Resource Strain: Low Risk  (09/04/2024)  Physical Activity: Sufficiently Active (11/14/2024)  Social Connections: Moderately Integrated (11/14/2024)  Stress: No Stress Concern Present (11/14/2024)  Tobacco Use: Low Risk  (11/14/2024)  Health Literacy: Adequate Health Literacy (11/14/2024)   See flowsheets for full screening details  Depression Screen PHQ 2 & 9 Depression Scale- Over the past 2 weeks, how often have you been bothered by any of the following problems? Little interest or pleasure in doing things: 0 Feeling down, depressed, or hopeless (PHQ Adolescent also includes...irritable): 0 PHQ-2 Total Score: 0 Trouble falling or staying asleep, or sleeping too much: 0 Feeling tired or having little energy: 0 Poor appetite or overeating (PHQ Adolescent also includes...weight loss): 0 Feeling bad about yourself - or that you are a failure or have let yourself or your family down: 0 Trouble concentrating on things, such as reading the newspaper or watching television (PHQ Adolescent also includes...like school work): 0 Moving or speaking so slowly that other people could have noticed. Or the opposite - being so fidgety or restless that you have been moving around a lot more than usual: 0 Thoughts that you would be better off dead, or of hurting  yourself in some way: 0 PHQ-9 Total Score: 0 If you checked off any problems, how difficult have these problems made it for you to do your work, take care of things at home, or get along with other people?: Not difficult at all     Goals Addressed             This Visit's Progress    Remain active and independent   On track      Visit info / Clinical Intake: Medicare Wellness Visit Type:: Subsequent Annual Wellness Visit Persons  participating in visit:: patient Medicare Wellness Visit Mode:: Telephone If telephone:: video declined Because this visit was a virtual/telehealth visit:: vitals recorded from last visit If Telephone or Video please confirm:: I connected with the patient using audio enabled telemedicine application and verified that I am speaking with the correct person using two identifiers; The patient expressed understanding and agreed to proceed; I discussed the limitations of evaluation and management by telemedicine Patient Location:: home Provider Location:: office Information given by:: patient Interpreter Needed?: No Pre-visit prep was completed: yes AWV questionnaire completed by patient prior to visit?: no Living arrangements:: (!) lives alone Patient's Overall Health Status Rating: very good Typical amount of pain: none Does pain affect daily life?: no Are you currently prescribed opioids?: no  Dietary Habits and Nutritional Risks How many meals a day?: 3 Eats fruit and vegetables daily?: yes Most meals are obtained by: preparing own meals Diabetic:: no  Functional Status Activities of Daily Living (to include ambulation/medication): Independent Ambulation: Independent Medication Administration: Independent Home Management: Independent Manage your own finances?: yes Primary transportation is: driving Concerns about vision?: (!) yes Concerns about hearing?: no  Fall Screening Falls in the past year?: 0 Number of falls in past year: 0 Was there an injury with Fall?: 0 Fall Risk Category Calculator: 0 Patient Fall Risk Level: Low Fall Risk  Fall Risk Patient at Risk for Falls Due to: No Fall Risks Fall risk Follow up: Falls evaluation completed; Education provided; Falls prevention discussed  Home and Transportation Safety: All rugs have non-skid backing?: yes All stairs or steps have railings?: yes Grab bars in the bathtub or shower?: yes Have non-skid surface in bathtub or  shower?: yes Good home lighting?: yes Regular seat belt use?: yes Hospital stays in the last year:: no  Cognitive Assessment Difficulty concentrating, remembering, or making decisions? : yes Will 6CIT or Mini Cog be Completed: no 6CIT or Mini Cog Declined: patient alert, oriented, able to answer questions appropriately and recall recent events  Advance Directives (For Healthcare) Does Patient Have a Medical Advance Directive?: No Would patient like information on creating a medical advance directive?: Yes (MAU/Ambulatory/Procedural Areas - Information given)  Reviewed/Updated  Reviewed/Updated: Reviewed All (Medical, Surgical, Family, Medications, Allergies, Care Teams, Patient Goals)        Objective:    Today's Vitals   11/14/24 0958  Weight: 137 lb (62.1 kg)  Height: 5' 5 (1.651 m)   Body mass index is 22.8 kg/m.  Current Medications (verified) Outpatient Encounter Medications as of 11/14/2024  Medication Sig   Ascorbic Acid (VITAMIN C) 500 MG CHEW Chew 1,000 mg by mouth daily.   Calcium Carbonate (CALTRATE 600 PO) Take 600 mg by mouth daily at 12 noon. Chew   Carboxymethylcellulose Sodium (THERATEARS) 0.25 % SOLN Place 1 drop into both eyes in the morning, at noon, and at bedtime.   cholecalciferol (VITAMIN D3) 25 MCG (1000 UNIT) tablet Take 1,000 Units by mouth daily.  clindamycin  (CLEOCIN ) 300 MG capsule TAKE TWO CAPSULES BY MOUTH 30 MINUTES PRIOR TO DENTAL PROCEDURE   hydrochlorothiazide  (HYDRODIURIL ) 25 MG tablet Take 2 tablets (50 mg total) by mouth daily.   meclizine (ANTIVERT) 12.5 MG tablet Take 12.5 mg by mouth 3 (three) times daily as needed for dizziness.   OVER THE COUNTER MEDICATION K2 d3   Pediatric Multivitamins-Iron (FLINTSTONES PLUS IRON PO) Take 1 tablet by mouth daily.   pyridOXINE (VITAMIN B-6) 100 MG tablet Take 200 mg by mouth daily.   vitamin B-12 (CYANOCOBALAMIN ) 1000 MCG tablet Take 1,000 mcg by mouth daily.   No facility-administered  encounter medications on file as of 11/14/2024.   Hearing/Vision screen Hearing Screening - Comments:: Patient is able to hear conversational tones without difficulty. No issues reported.   Vision Screening - Comments:: Wears rx glasses - up to date with routine eye exams  Immunizations and Health Maintenance Health Maintenance  Topic Date Due   Hepatitis C Screening  Never done   COVID-19 Vaccine (1 - 2025-26 season) Never done   Medicare Annual Wellness (AWV)  11/14/2025   DTaP/Tdap/Td (2 - Td or Tdap) 06/03/2030   Pneumococcal Vaccine: 50+ Years  Completed   Influenza Vaccine  Completed   Bone Density Scan  Completed   Zoster Vaccines- Shingrix  Completed   Meningococcal B Vaccine  Aged Out   Mammogram  Discontinued        Assessment/Plan:  This is a routine wellness examination for Marlissa.  Patient Care Team: Grooms, Charmaine, [place] as PCP - General (Physician Assistant) Margrette Taft BRAVO, MD as Consulting Physician (Orthopedic Surgery)  I have personally reviewed and noted the following in the patient's chart:   Medical and social history Use of alcohol, tobacco or illicit drugs  Current medications and supplements including opioid prescriptions. Functional ability and status Nutritional status Physical activity Advanced directives List of other physicians Hospitalizations, surgeries, and ER visits in previous 12 months Vitals Screenings to include cognitive, depression, and falls Referrals and appointments  No orders of the defined types were placed in this encounter.  In addition, I have reviewed and discussed with patient certain preventive protocols, quality metrics, and best practice recommendations. A written personalized care plan for preventive services as well as general preventive health recommendations were provided to patient.   Lavelle Charmaine Browner, LPN   [id number]   Return in 1 year (on 11/14/2025).  After Visit Summary: (MyChart) Due to  this being a telephonic visit, the after visit summary with patients personalized plan was offered to patient via MyChart   Nurse Notes: No concerns at this time; would like to hold on ordering dexa until discussing it further with pcp

## 2024-11-27 ENCOUNTER — Encounter (HOSPITAL_COMMUNITY): Payer: Self-pay

## 2024-11-27 ENCOUNTER — Inpatient Hospital Stay (HOSPITAL_COMMUNITY): Admission: RE | Admit: 2024-11-27 | Discharge: 2024-11-27 | Attending: Physician Assistant

## 2024-11-27 DIAGNOSIS — Z1231 Encounter for screening mammogram for malignant neoplasm of breast: Secondary | ICD-10-CM | POA: Diagnosis present

## 2024-11-28 ENCOUNTER — Ambulatory Visit (HOSPITAL_COMMUNITY)

## 2024-12-08 ENCOUNTER — Ambulatory Visit: Payer: Self-pay | Admitting: Physician Assistant

## 2025-01-12 ENCOUNTER — Other Ambulatory Visit: Payer: Self-pay | Admitting: Physician Assistant

## 2025-11-27 ENCOUNTER — Ambulatory Visit
